# Patient Record
Sex: Male | Born: 1960
Health system: Southern US, Community
[De-identification: ages and names within clinical notes are randomized; demographics above are authoritative.]

## PROBLEM LIST (undated history)

## (undated) DIAGNOSIS — I10 Essential (primary) hypertension: Secondary | ICD-10-CM

## (undated) DIAGNOSIS — Z9989 Dependence on other enabling machines and devices: Secondary | ICD-10-CM

## (undated) DIAGNOSIS — R0789 Other chest pain: Secondary | ICD-10-CM

## (undated) DIAGNOSIS — R7309 Other abnormal glucose: Secondary | ICD-10-CM

## (undated) DIAGNOSIS — R053 Chronic cough: Secondary | ICD-10-CM

## (undated) DIAGNOSIS — K219 Gastro-esophageal reflux disease without esophagitis: Secondary | ICD-10-CM

## (undated) DIAGNOSIS — E230 Hypopituitarism: Secondary | ICD-10-CM

## (undated) DIAGNOSIS — R05 Cough: Secondary | ICD-10-CM

## (undated) DIAGNOSIS — E291 Testicular hypofunction: Secondary | ICD-10-CM

## (undated) DIAGNOSIS — Z8679 Personal history of other diseases of the circulatory system: Secondary | ICD-10-CM

## (undated) DIAGNOSIS — G4733 Obstructive sleep apnea (adult) (pediatric): Secondary | ICD-10-CM

## (undated) DIAGNOSIS — F411 Generalized anxiety disorder: Secondary | ICD-10-CM

## (undated) DIAGNOSIS — J309 Allergic rhinitis, unspecified: Secondary | ICD-10-CM

## (undated) DIAGNOSIS — E785 Hyperlipidemia, unspecified: Secondary | ICD-10-CM

## (undated) HISTORY — DX: Other abnormal glucose: R73.09

## (undated) HISTORY — DX: Generalized anxiety disorder: F41.1

## (undated) HISTORY — DX: Gastro-esophageal reflux disease without esophagitis: K21.9

## (undated) HISTORY — DX: Cough: R05

## (undated) HISTORY — DX: Obstructive sleep apnea (adult) (pediatric): Z99.89

## (undated) HISTORY — DX: Allergic rhinitis, unspecified: J30.9

## (undated) HISTORY — DX: Testicular hypofunction: E29.1

## (undated) HISTORY — DX: Obstructive sleep apnea (adult) (pediatric): G47.33

## (undated) HISTORY — DX: Hyperlipidemia, unspecified: E78.5

## (undated) HISTORY — DX: Other chest pain: R07.89

## (undated) HISTORY — DX: Hypopituitarism: E23.0

## (undated) HISTORY — DX: Personal history of other diseases of the circulatory system: Z86.79

## (undated) HISTORY — DX: Chronic cough: R05.3

---

## 1997-07-05 ENCOUNTER — Encounter: Admission: RE | Admit: 1997-07-05 | Discharge: 1997-10-03 | Payer: Self-pay

## 1999-01-02 ENCOUNTER — Encounter: Payer: Self-pay | Admitting: Emergency Medicine

## 1999-01-02 ENCOUNTER — Observation Stay (HOSPITAL_COMMUNITY): Admission: EM | Admit: 1999-01-02 | Discharge: 1999-01-03 | Payer: Self-pay | Admitting: Emergency Medicine

## 1999-01-03 HISTORY — PX: OTHER SURGICAL HISTORY: SHX169

## 2001-04-02 HISTORY — PX: OTHER SURGICAL HISTORY: SHX169

## 2002-04-02 HISTORY — PX: BREAST REDUCTION SURGERY: SHX8

## 2003-10-15 ENCOUNTER — Ambulatory Visit (HOSPITAL_BASED_OUTPATIENT_CLINIC_OR_DEPARTMENT_OTHER): Admission: RE | Admit: 2003-10-15 | Discharge: 2003-10-15 | Payer: Self-pay | Admitting: Otolaryngology

## 2003-11-04 ENCOUNTER — Emergency Department (HOSPITAL_COMMUNITY): Admission: EM | Admit: 2003-11-04 | Discharge: 2003-11-04 | Payer: Self-pay | Admitting: Emergency Medicine

## 2004-02-09 ENCOUNTER — Ambulatory Visit: Payer: Self-pay | Admitting: Family Medicine

## 2004-02-15 ENCOUNTER — Ambulatory Visit: Payer: Self-pay | Admitting: Endocrinology

## 2004-02-18 ENCOUNTER — Ambulatory Visit: Payer: Self-pay | Admitting: Endocrinology

## 2004-03-08 ENCOUNTER — Ambulatory Visit (HOSPITAL_COMMUNITY): Admission: RE | Admit: 2004-03-08 | Discharge: 2004-03-08 | Payer: Self-pay | Admitting: Endocrinology

## 2004-04-25 ENCOUNTER — Ambulatory Visit: Payer: Self-pay | Admitting: Family Medicine

## 2004-05-02 ENCOUNTER — Ambulatory Visit: Payer: Self-pay | Admitting: Endocrinology

## 2004-11-30 ENCOUNTER — Observation Stay (HOSPITAL_COMMUNITY): Admission: RE | Admit: 2004-11-30 | Discharge: 2004-12-01 | Payer: Self-pay | Admitting: Otolaryngology

## 2004-11-30 ENCOUNTER — Encounter (INDEPENDENT_AMBULATORY_CARE_PROVIDER_SITE_OTHER): Payer: Self-pay | Admitting: *Deleted

## 2005-01-19 ENCOUNTER — Ambulatory Visit: Payer: Self-pay | Admitting: Internal Medicine

## 2005-03-02 ENCOUNTER — Ambulatory Visit (HOSPITAL_BASED_OUTPATIENT_CLINIC_OR_DEPARTMENT_OTHER): Admission: RE | Admit: 2005-03-02 | Discharge: 2005-03-02 | Payer: Self-pay | Admitting: Otolaryngology

## 2005-03-11 ENCOUNTER — Ambulatory Visit: Payer: Self-pay | Admitting: Internal Medicine

## 2005-05-21 ENCOUNTER — Ambulatory Visit: Payer: Self-pay | Admitting: Family Medicine

## 2005-05-29 ENCOUNTER — Encounter: Admission: RE | Admit: 2005-05-29 | Discharge: 2005-05-29 | Payer: Self-pay | Admitting: Endocrinology

## 2005-06-22 ENCOUNTER — Ambulatory Visit: Payer: Self-pay | Admitting: Endocrinology

## 2005-08-03 ENCOUNTER — Ambulatory Visit: Payer: Self-pay | Admitting: Endocrinology

## 2006-02-13 ENCOUNTER — Ambulatory Visit: Payer: Self-pay | Admitting: Family Medicine

## 2006-03-11 ENCOUNTER — Ambulatory Visit (HOSPITAL_COMMUNITY): Admission: RE | Admit: 2006-03-11 | Discharge: 2006-03-11 | Payer: Self-pay | Admitting: *Deleted

## 2006-03-11 ENCOUNTER — Encounter (INDEPENDENT_AMBULATORY_CARE_PROVIDER_SITE_OTHER): Payer: Self-pay | Admitting: Specialist

## 2006-12-30 ENCOUNTER — Encounter: Payer: Self-pay | Admitting: *Deleted

## 2006-12-30 DIAGNOSIS — F411 Generalized anxiety disorder: Secondary | ICD-10-CM | POA: Insufficient documentation

## 2006-12-30 DIAGNOSIS — J309 Allergic rhinitis, unspecified: Secondary | ICD-10-CM

## 2006-12-30 DIAGNOSIS — R7309 Other abnormal glucose: Secondary | ICD-10-CM

## 2006-12-30 DIAGNOSIS — E291 Testicular hypofunction: Secondary | ICD-10-CM

## 2006-12-30 DIAGNOSIS — E785 Hyperlipidemia, unspecified: Secondary | ICD-10-CM

## 2006-12-30 HISTORY — DX: Testicular hypofunction: E29.1

## 2006-12-30 HISTORY — DX: Generalized anxiety disorder: F41.1

## 2006-12-30 HISTORY — DX: Other abnormal glucose: R73.09

## 2006-12-30 HISTORY — DX: Allergic rhinitis, unspecified: J30.9

## 2006-12-30 HISTORY — DX: Hyperlipidemia, unspecified: E78.5

## 2007-02-04 ENCOUNTER — Ambulatory Visit: Payer: Self-pay | Admitting: Family Medicine

## 2007-02-18 ENCOUNTER — Telehealth (INDEPENDENT_AMBULATORY_CARE_PROVIDER_SITE_OTHER): Payer: Self-pay | Admitting: *Deleted

## 2007-02-20 ENCOUNTER — Ambulatory Visit: Payer: Self-pay | Admitting: Endocrinology

## 2007-02-21 LAB — CONVERTED CEMR LAB: Testosterone: 308.19 ng/dL — ABNORMAL LOW (ref 350.00–890)

## 2008-03-29 ENCOUNTER — Telehealth: Payer: Self-pay | Admitting: Endocrinology

## 2008-04-08 ENCOUNTER — Ambulatory Visit: Payer: Self-pay | Admitting: Endocrinology

## 2008-04-08 DIAGNOSIS — E230 Hypopituitarism: Secondary | ICD-10-CM

## 2008-04-08 HISTORY — DX: Hypopituitarism: E23.0

## 2008-04-08 LAB — CONVERTED CEMR LAB
Iron: 81 ug/dL (ref 42–165)
Saturation Ratios: 19.1 % — ABNORMAL LOW (ref 20.0–50.0)
Testosterone: 409.91 ng/dL (ref 350.00–890)
Transferrin: 303.5 mg/dL (ref >=212.0)

## 2009-05-05 ENCOUNTER — Ambulatory Visit: Payer: Self-pay | Admitting: Internal Medicine

## 2009-05-05 DIAGNOSIS — R059 Cough, unspecified: Secondary | ICD-10-CM | POA: Insufficient documentation

## 2009-05-05 DIAGNOSIS — G4733 Obstructive sleep apnea (adult) (pediatric): Secondary | ICD-10-CM | POA: Insufficient documentation

## 2009-05-05 DIAGNOSIS — R05 Cough: Secondary | ICD-10-CM

## 2009-05-24 ENCOUNTER — Emergency Department (HOSPITAL_BASED_OUTPATIENT_CLINIC_OR_DEPARTMENT_OTHER): Admission: EM | Admit: 2009-05-24 | Discharge: 2009-05-24 | Payer: Self-pay | Admitting: Emergency Medicine

## 2009-07-29 ENCOUNTER — Ambulatory Visit: Payer: Self-pay | Admitting: Endocrinology

## 2009-07-29 LAB — CONVERTED CEMR LAB
PSA: 0.39 ng/mL (ref 0.10–4.00)
Testosterone: 377.65 ng/dL (ref 350.00–890.00)

## 2010-05-04 NOTE — Miscellaneous (Signed)
  Clinical Lists Changes  Medications: Changed medication from CLOMID 50 MG TABS (CLOMIPHENE CITRATE) one fourth of a tablet, daily to CLOMID 50 MG TABS (CLOMIPHENE CITRATE) one half of a tablet, daily - Signed Rx of CLOMID 50 MG TABS (CLOMIPHENE CITRATE) one half of a tablet, daily;  #45 x 3;  Signed;  Entered by: Minus Breeding MD;  Authorized by: Minus Breeding MD;  Method used: Electronic    Prescriptions: CLOMID 50 MG TABS (CLOMIPHENE CITRATE) one half of a tablet, daily  #45 x 3   Entered and Authorized by:   Minus Breeding MD   Signed by:   Minus Breeding MD on 02/20/2007   Method used:   Electronically sent to ...       [facility] [address]       [address]       [place], [place]  [address]       Ph: [phone]       Fax: [phone]   RxID:   [id number]

## 2010-05-04 NOTE — Progress Notes (Signed)
  Medications Added CLOMID 50 MG TABS (CLOMIPHENE CITRATE)        Phone Note Call from Patient Call back at Home Phone [phone]   Caller: Spouse/karen Call For: dr Everardo All Summary of Call: does he need to have labs  done before  his  appointment for 02/20/07 Patient's chart has been requested.  Initial call taken by: Shelbie Proctor,  February 18, 2007 1:12 PM  Follow-up for Phone Call        no, let's do at appt Follow-up by: Minus Breeding MD,  February 18, 2007 3:59 PM  Additional Follow-up for Phone Call Additional follow up Details #1::        Phone Call Completed, Provider Notified  PT NOTIFIED SPOKE WITH PT MNADE AWARE Additional Follow-up by: Shelbie Proctor,  February 18, 2007 4:32 PM    New/Updated Medications: CLOMID 50 MG TABS (CLOMIPHENE CITRATE)

## 2010-05-04 NOTE — Assessment & Plan Note (Signed)
Summary: MED REFILL  FU---STC   Vital Signs:  Patient profile:   50 year old male Height:      69 inches (175.26 cm) Weight:      232.38 pounds (105.63 kg) O2 Sat:      96 % on Room air Temp:     98.8 degrees F (37.11 degrees C) oral Pulse rate:   73 / minute BP sitting:   120 / 86  (left arm) Cuff size:   regular  Vitals Entered By: Josph Macho RMA (July 29, 2009 1:30 PM)  O2 Flow:  Room air CC: Follow up / pt needs med refill on Clomid/ pt states he is no longer taking Tramadol, Prednisone, or Benzonatate/ CF Is Patient Diabetic? No   Referring Provider:  Self Primary Provider:  Dr. Merri Brunette  CC:  Follow up / pt needs med refill on Clomid/ pt states he is no longer taking Tramadol, Prednisone, and or Benzonatate/ CF.  History of Present Illness: pt states the clomid works well.  he says ed is mild.  he attributes fatigue to age.    Current Medications (verified): 1)  Clomid 50 Mg Tabs (Clomiphene Citrate) .... One Half of A Tablet, Daily 2)  Benzonatate 100 Mg Caps (Benzonatate) .Marland Kitchen.. 1 Every 6 Hours As Needed For Cough 3)  Prednisone 10 Mg  Tabs (Prednisone) .... 4 Each Am X 2days, 2x2days, 1x2days and Stop 4)  Tramadol Hcl 50 Mg  Tabs (Tramadol Hcl) .... One To Two By Mouth Every 4-6 Hours  Allergies (verified): No Known Drug Allergies  Past History:  Past Medical History: Last updated: 05/05/2009 OBSTRUCTIVE SLEEP APNEA (ICD-327.23) PITUITARY INSUFFICIENCY (ICD-253.2) HYPERGLYCEMIA (ICD-790.29) TESTOSTERONE DEFICIENCY (ICD-257.2) HYPERLIPIDEMIA (ICD-272.4) ANXIETY (ICD-300.00) ALLERGIC RHINITIS (ICD-477.9) Chronic cough     - onset Dec 2010  GERD      - Pos EGD per Pt Virginia Rochester)   Review of Systems       he has slightly decreased urinary stream.  Physical Exam  General:  normal appearance.   Genitalia:  testes are small bilaterally Additional Exam:  Testosterone              377.65 ng/dL                161.09-604.54 PSA-Hyb                   0.39  ng/mL                  0.10-4.00    Impression & Recommendations:  Problem # 1:  TESTOSTERONE DEFICIENCY (ICD-257.2) well-controlled  Other Orders: TLB-Testosterone, Total (84403-TESTO) TLB-PSA (Prostate Specific Antigen) (84153-PSA) Est. Patient Level III (09811)  Patient Instructions: 1)  tests are being ordered for you today.  a few days after the test(s), please call [phone] to hear your test results. 2)  pending the test results, please continue the same medications for now 3)  Please schedule a follow-up appointment in 1 year. 4)  (update: i left message on phone-tree:  rx as we discussed) Prescriptions: CLOMID 50 MG TABS (CLOMIPHENE CITRATE) one half of a tablet, daily  #45 x 3   Entered and Authorized by:   Minus Breeding MD   Signed by:   Minus Breeding MD on 07/29/2009   Method used:   Print then Give to Patient   RxID:   [id number]

## 2010-05-04 NOTE — Assessment & Plan Note (Signed)
Summary: NEEDS MED REFILL/NML   Vital Signs:  Patient Profile:   50 Years Old Male Weight:      222 pounds Temp:     97.3 degrees F oral Pulse rate:   63 / minute BP sitting:   108 / 72  (left arm) Cuff size:   large  Vitals Entered By: Orlan Leavens (February 20, 2007 3:41 PM)                 Visit Type:  f/u  Chief Complaint:  hypogonadism.  History of Present Illness: patient takes Clomid as prescribed, one fourth of a 50-mg pill daily. he says it works well.   Current Allergies: No known allergies   Past Medical History:    Reviewed history from 12/30/2006 and no changes required:       Allergic rhinitis       Anxiety       Hyperlipidemia     Review of Systems       denies urinary hesitancy   Physical Exam  General:     healthy appearing.   Genitalia:     normal male, testes descended bilaterally without masses, no hernias noted.  left testicle is slightly small. Additional Exam:     testosterone=308 (350-850)    Impression & Recommendations:  Problem # 1:  TESTOSTERONE DEFICIENCY (ICD-257.2) needs increased rx Orders: Est. Patient Level III ([address])   Medications Added to Medication List This Visit: 1)  Clomid 50 Mg Tabs (Clomiphene citrate) .... One fourth of a tablet, daily  Other Orders: TLB-Testosterone, Total (84403-TESTO)   Patient Instructions: 1)  increase clomid to 1/2 of a 50 mg once daily 2)  recheck testosterone and psa 30d 3)  ret prn 4)  cc dr Renne Crigler    Prescriptions: CLOMID 50 MG TABS (CLOMIPHENE CITRATE) one fourth of a tablet, daily  #25 x 3   Entered and Authorized by:   Minus Breeding MD   Signed by:   Minus Breeding MD on 02/20/2007   Method used:   Electronically sent to ...       [facility] [address]       [address]       [place], [place]  [address]       Ph: [phone]       Fax: [phone]   RxID:   [id number]  ]

## 2010-05-04 NOTE — Progress Notes (Signed)
Summary: RX  Phone Note Call from Patient Call back at Home Phone [phone]   Caller: Spouse Call For: Minus Breeding MD Summary of Call: PER .PT WIFE NEED A REFILL OM MEDICATION APPT SCHEDULED FOR DR Lorane Gell  JAN 7  CLOMID 50MG          TAB    Instructions: 1/4QD - TAKE ONE-FOURTH TABLET BY MOUTH EVERY DAY [facility] [address]   Pharmacy Phone: [phone] Initial call taken by: Shelbie Proctor,  March 29, 2008 10:21 AM      Prescriptions: CLOMID 50 MG TABS (CLOMIPHENE CITRATE) one half of a tablet, daily  #45 x 0   Entered by:   Payton Spark CMA   Authorized by:   Minus Breeding MD   Signed by:   Payton Spark CMA on 03/29/2008   Method used:   Electronically to        [facility] [address] (retail)       [phone]       [place], [place]  [address]       Ph: [phone]       Fax: [phone]   RxID:   [id number]

## 2010-05-04 NOTE — Assessment & Plan Note (Signed)
Summary: Pulmonary new pt eval/ cough ? all gerd after uri   Visit Type:  Initial Consult Copy to:  Self Primary Provider/Referring Provider:  Dr. Merri Brunette  CC:  Cough.  History of Present Illness: 50 yowm never smoker with h/o allergies and sleep apnea with as needed use of allegra which has declined in intensity since sinus surgery 2005  May 05, 2009 initial pulmonary cc abupt onset before NY's with stuffy head, nasal congestion mostly dry cough and constant tickle in the throat esp with talking not exac early in am.  takes a prilosec every 3-4 days for hb despite prev egd x 3 years GERD per Dr Virginia Rochester.    Pt denies any significant sore throat, dysphagia, itching, sneezing,  nasal congestion or excess secretions,  fever, chills, sweats, unintended wt loss, pleuritic or exertional cp, hempoptysis, change in activity tolerance  orthopnea pnd or leg swelling Pt also denies any obvious fluctuation in symptoms with weather or environmental change or other alleviating or aggravating factors.       Current Medications (verified): 1)  Clomid 50 Mg Tabs (Clomiphene Citrate) .... One Half of A Tablet, Daily 2)  Benzonatate 100 Mg Caps (Benzonatate) .Marland Kitchen.. 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  The patient denies productive cough, coughing  up blood, irregular heartbeats, indigestion, loss of appetite, weight change, abdominal pain, difficulty swallowing, sore throat, tooth/dental problems, headaches, nasal congestion/difficulty breathing through nose, sneezing, itching, ear ache, anxiety, depression, hand/feet swelling, joint stiffness or pain, rash, change in color of mucus, and fever.    Vital Signs:  Patient profile:   50 year old male Height:      69 inches Weight:      228.50 pounds BMI:     48.69 O2 Sat:      96 % on Room air Temp:     97.7 degrees F oral Pulse rate:   76 / minute BP sitting:   120 / 80  (left arm)  Vitals Entered By: Vernie Murders (May 05, 2009 2:48 PM)  O2 Flow:  Room air  Physical Exam  Additional Exam:  wt 228 May 05, 2009 hoarse amb wm with harsh barking quality upper airway cough with pseudowheeze resolves with purse lip maneuver  HEENT: nl dentition, turbinates, and orophanx. Nl external ear canals without cough reflex NECK :  without JVD/Nodes/TM/ nl carotid upstrokes bilaterally LUNGS: no acc muscle use, clear to A and P bilaterally without cough on insp or exp maneuvers CV:  RRR  no s3 or murmur or increase in P2, no edema  ABD:  soft and nontender with nl excursion in the supine position. No bruits or organomegaly, bowel sounds nl MS:  warm without deformities, calf tenderness, cyanosis or clubbing SKIN: warm and dry without lesions   NEURO:  alert, approp, no deficits     CXR  Procedure date:  05/05/2009  Findings:      Comparison: None   Findings: Cardiomediastinal silhouette is unremarkable.  No acute infiltrate or pleural effusion.  No pulmonary edema.  Bony thorax is unremarkable.   IMPRESSION: No active disease.  Impression & Recommendations:  Problem # 1:  COUGH (ICD-786.2) The most common causes of chronic cough in immunocompetent adults include: upper airway cough syndrome (UACS), previously referred to as postnasal drip syndrome,  caused by variety  of rhinosinus conditions; (2) asthma; (3) GERD; (4) chronic bronchitis from cigarette smoking or other inhaled environmental irritants; (5) nonasthmatic eosinophilic bronchitis; and (6) bronchiectasis. These conditions, singly or in combination, have accounted for up to 94% of the causes of chronic cough in prospective studies.   Of the three most common causes of chronic cough, only oneGERD can actually cause the other two (asthma/sinusitis) and perpetuate the cylce of cough inducing airway trauma, inflammation, heightened sensitivity to reflux which is prompted by the cough itself via a cyclical mechanism.  This may partially respond to steroids and look like asthma and post nasal drainage but never erradicated completely unless the cough and the secondary reflux are eliminated, preferably both at the same time.   The standardized cough guidelines recently published in Chest are a 14 step process, not a single office visit,  and are intended  to address this problem logically,  with an alogrithm dependent on response to each progressive step  to determine a specific diagnosis with  minimal addtional testing needed. Therefore if compliance is an issue this empiric standardized approach simply won't work.   Comment:  while not intuitively obvious, many patients with chronic reflux did not cough until there is a second insult it disturbs the epithelial barrier that protects the upper airway from the effects of reflux.  Sometimes this is a viral URI, sometimes it is an endotracheal tube or other direct physical insult. Therefore once the cycle of coughing is eliminated, the reflux does not need to be treated as aggressively.    See instructions for specific recommendations   Medications Added to Medication List This Visit: 1)  Benzonatate 100 Mg Caps (Benzonatate) .Marland Kitchen.. 1 every 6 hours as needed for cough 2)  Prednisone 10 Mg Tabs (Prednisone) .... 4 each am x 2days, 2x2days, 1x2days and stop 3)  Tramadol  Hcl 50 Mg Tabs (Tramadol hcl) .... One to two by mouth every 4-6 hours  Other Orders: T-2 View CXR (71020TC) New Patient Level V ([address])  Patient Instructions: 1)  Acid reflux is the leading suspect here and needs to be eliminated  completely before considering additional studies or treatment options. To suppress this maximally, take prilosec 30 min I  before first and last meal and pepcid 20 mg (otc) at bedtime plus diet measures as listed until cough gone for two weeks without the need for cough suppression 2)  Take delsym two tsp every 12 hours and add tramadol 50 mg up to 2 every 4 hours to suppress the urge to cough. Swallowing water or using ice chips/non mint and menthol containing candies (such as lifesavers or sugarless jolly ranchers) are also effective.  3)  GERD (REFLUX)  is a common cause of respiratory symptoms. It commonly presents without heartburn and can be treated with medication, but also with lifestyle changes including avoidance of late meals, excessive alcohol, smoking cessation, and avoid fatty foods, chocolate, peppermint, colas, red wine, and acidic juices such as orange juice. NO MINT OR MENTHOL PRODUCTS SO NO COUGH  DROPS  4)  USE SUGARLESS CANDY INSTEAD (jolley ranchers)  5)  NO OIL BASED VITAMINS  6)  Prednisone 10mg  4 each am x 2days, 2x2days, 1x2days and stop  Prescriptions: TRAMADOL HCL 50 MG  TABS (TRAMADOL HCL) One to two by mouth every 4-6 hours  #40 x 0   Entered and Authorized by:   Nyoka Cowden MD   Signed by:   Nyoka Cowden MD on 05/05/2009   Method used:   Electronically to        [facility] [phone]* (retail)       [address] [place], [place]  [address]       Ph: [phone]       Fax: [phone]   RxID:   [id number] PREDNISONE 10 MG  TABS (PREDNISONE) 4 each am x 2days, 2x2days, 1x2days and stop  #14 x 0   Entered and Authorized by:   Nyoka Cowden MD   Signed by:   Nyoka Cowden MD on 05/05/2009   Method used:    Electronically to        [facility] [phone]* (retail)       [address] [place], [place]  [address]       Ph: [phone]       Fax: [phone]   RxID:   [id number]

## 2010-08-18 NOTE — Op Note (Signed)
Ross Moore, Ross Moore             ACCOUNT NO.:  [id number]   MEDICAL RECORD NO.:  [id number]          PATIENT TYPE:  AMB   LOCATION:  ENDO                         FACILITY:  [facility]   PHYSICIAN:  Georgiana Spinner, M.D.    DATE OF BIRTH:  1960-12-11   DATE OF PROCEDURE:  03/11/2006  DATE OF DISCHARGE:                               OPERATIVE REPORT   PROCEDURE:  Colonoscopy.   INDICATIONS:  Hemoccult positivity.   ANESTHESIA:  Demerol 30 mg, Versed 2 mg.   PROCEDURE:  With the patient mildly sedated in the left lateral  decubitus position, the Olympus videoscopic colonoscope was inserted  into the rectum and passed under direct vision to the cecum, identified  by the ileocecal valve and appendiceal orifice, both of which were  photographed.  From this point the colonoscope was slowly withdrawn  taking circumferential views of the colonic mucosa, stopping only in the  rectum, which appeared normal on direct and showed hemorrhoids on  retroflexed view.  The endoscope was straightened and withdrawn.  The  patient's vital signs and pulse oximeter remained stable.  The patient  tolerated the procedure well without apparent complications.   FINDINGS:  Internal hemorrhoids, otherwise an unremarkable colonoscopic  examination to the cecum.   PLAN:  See endoscopy note for further details.           ______________________________  Georgiana Spinner, M.D.     GMO/MEDQ  D:  03/11/2006  T:  03/11/2006  Job:  [id number]

## 2010-08-18 NOTE — Op Note (Signed)
NAMEABU, HEAVIN             ACCOUNT NO.:  [id number]   MEDICAL RECORD NO.:  [id number]          PATIENT TYPE:  AMB   LOCATION:  ENDO                         FACILITY:  [facility]   PHYSICIAN:  Georgiana Spinner, M.D.    DATE OF BIRTH:  [birth year]   DATE OF PROCEDURE:  03/11/2006  DATE OF DISCHARGE:                               OPERATIVE REPORT   PROCEDURE:  Upper endoscopy with biopsy.   INDICATIONS:  Reflux.   ANESTHESIA:  Demerol 70, Versed 8 mg.   PROCEDURE:  With the patient mildly sedated in the left lateral  decubitus position, the Olympus videoscopic endoscope was inserted in  the mouth and passed under direct vision through the esophagus which  appeared normal until we reached distal esophagus, and there were  changes of Barrett's photographed and biopsied.  We then entered into  the stomach.  Fundus, body, antrum, duodenal bulb, and second portion of  the duodenum appeared normal.  From this point, the endoscope was slowly  withdrawn taking circumferential views of duodenal mucosa until the  endoscope was pulled back into the stomach, placed in retroflexion to  view the stomach from below.  The endoscope was then straightened and  withdrawn, taking circumferential views remaining gastric and esophageal  mucosa.  The patient's vital signs and pulse oximeter remained stable.  The patient tolerated the procedure well without apparent complications.   FINDINGS:  Endoscopic changes of Barrett's esophagus.  Await biopsy  report.  The patient will call me for results and follow-up with me as  an outpatient.  Proceed to colonoscopy as planned.           ______________________________  Georgiana Spinner, M.D.     GMO/MEDQ  D:  03/11/2006  T:  03/11/2006  Job:  [id number]

## 2010-08-18 NOTE — Op Note (Signed)
NAMEJAVONE, YBANEZ             ACCOUNT NO.:  [id number]   MEDICAL RECORD NO.:  [id number]          PATIENT TYPE:  AMB   LOCATION:  SDS                          FACILITY:  [facility]   PHYSICIAN:  Kinnie Scales. Annalee Genta, M.D.DATE OF BIRTH:  [birth year]   DATE OF PROCEDURE:  11/30/2004  DATE OF DISCHARGE:                                 OPERATIVE REPORT   PREOPERATIVE DIAGNOSIS:  1.  Moderately severe obstructive sleep apnea.  2.  Deviated nasal septum and nasal obstruction.  3.  Tonsil and uvula palatal hypertrophy.  4.  Inferior turbinate hypertrophy   POSTOPERATIVE DIAGNOSIS:  1.  Moderately severe obstructive sleep apnea.  2.  Deviated nasal septum and nasal obstruction.  3.  Tonsil and uvula palatal hypertrophy.  4.  Inferior turbinate hypertrophy   INDICATIONS FOR SURGERY:  1.  Moderately severe obstructive sleep apnea.  2.  Deviated nasal septum and nasal obstruction.  3.  Tonsil and uvula palatal hypertrophy.  4.  Inferior turbinate hypertrophy.   SURGICAL PROCEDURE:  1.  Uvulopalatopharyngoplasty.  2.  Nasal septoplasty.  3.  Tonsillectomy.  4.  Inferior turbinate reduction.   ANESTHESIA:  General endotracheal.   SURGEON:  Kinnie Scales. Annalee Genta, M.D.   COMPLICATIONS:  None.   BLOOD LOSS:  Less than 100 mL.   DISPOSITION:  Patient transferred from the operating room to recovery room  in stable condition.   BRIEF HISTORY:  The patient is a 50 year old white male who has been  followed with a history of snoring and sleep apnea. The patient underwent an  inpatient sleep study at [facility] on October 15, 2003 which showed  an RDI of 32.6 events per hour with an O2 nadir to 87%. He was diagnosed  with moderately severe obstructive sleep apnea and was titrated with C-PAP  with adequate initial control of sleep apnea. The patient diligently wore  his C-PAP for the next approximately 8-9 months with significant progressive  difficulty wearing C-PAP successfully on a  nightly basis. The patient  complained of symptoms of airway congestion, obstruction and pressure with  discomfort from the mask and despite initial good response was seen for  reevaluation and given his failure to tolerate long-term C-PAP I recommended  we consider him for additional surgical intervention for the management of  sleep apnea. Based on history and physical examination I recommended the  above surgical procedures. Risk, benefits, and possible complications of  each of these procedures were discussed in detail with the patient and his  wife who understood and concurred plan for surgery which is scheduled as  above.   SURGICAL PROCEDURE:  The patient brought to the operating room at [facility] OR where general endotracheal anesthesia was established  without difficulty. The patient was adequately anesthetized. The patient was  positioned on the operating table, prepped and draped in sterile fashion and  injected with a total of 10 mL of 1% lidocaine 1:100,000 solution  epinephrine injected in submucosal fashion along the nasal septum, inferior  turbinates bilaterally. The patient's nose was then packed with Afrin-soaked  cottonoid pledgets which  were left in place for approximately 10 minutes for  vasoconstriction and hemostasis. After allowing adequate time the patient's  surgical procedure was begun with a nasal septoplasty. A left  hemitransfixion incision was created using a #15 scalpel and was carried  through the mucosa and underlying submucosa and a mucoperichondrial flap was  elevated on the patient's left-hand side. Bony cartilaginous junction was  crossed in the midline and a mucoperiosteal flap was elevated on the  patient's right. Deviated bone and cartilage in the mid and posterior  aspects of the nasal septum were then mobilized and resected. Mid septal  cartilage was removed, morselized and returned the mucoperichondrial pocket  at the conclusion  of the surgical procedure.  The patient had significant  bony septal spurring and deviation and a 4 mm osteotome was used to mobilize  the mid and posterior nasal septal bones as well as a large inferior septal  spur preserving the overlying mucosa. With septum brought to the midline  cartilage returned to the mucoperichondrial pocket and a 4-0 gut suture on a  Keith needle in horizontal mattress fashion was used to reapproximate  hemitransfixion incision and to coapt the soft tissue flaps. Bilateral Doyle  nasal septal splints were then placed after the application of Bactroban  ointment and were sutured in position with a 3-0 Ethilon suture.   Attention was then turned to the inferior turbinates where bilateral  inferior turbinate intramural cautery was performed with cautery set at 12  watts, two pass made in submucosal fashion. Each inferior turbinate. When  the turbinates had been adequately cauterized, they were outfractured to  create a more patent nasal cavity.   Attention then turned the patient's oral cavity and oropharynx. A Crowe-  Davis mouth gag was inserted without difficulty. There no loose or broken  teeth and hard and soft palate were intact. The procedure begun by  performing tonsillectomy using Bovie electrocautery and dissecting  subcapsular fashion. The entire left tonsil was removed from superior pole  to tongue base. Right tonsil removed in similar fashion. The tonsillar fossa  were gently abraded with a dry tonsil sponge and several areas of point  hemorrhage were then cauterized with suction cautery. The Crowe-Davis mouth  gag was released and reapplied. There was no active bleeding.   With tonsils was removed, uvulopalatopharyngoplasty was undertaken. The  hypertrophic extent of the palate and uvula was then estimated and using  Bovie electrocautery, incision was made along the anterior aspect of the soft palate at approximately the level of the anterior  tonsillar pillars.  This was carried from anterior to posterior preserving the posterior palatal  mucosa for reconstruction, removing approximately 1 cm strip of tissue  including mucosa and muscular layers and the entire uvula. Tissue sent to  pathology department with the tonsils for gross microscopic evaluation. The  palatal margin was then cauterized to ensure hemostasis and reconstruction  was undertaken with 4-0 Vicryl suture on a tapered needle in horizontal  mattress fashion to advance the posterior tonsillar pillars and close the  tonsillar fossa. The posterior palatal mucosa was then advanced anteriorly  and closed in interrupted sutures and at the conclusion of procedure, the  patient's nasal cavity, nasopharynx, oral cavity,  oropharynx were irrigated and suctioned. There was no active bleeding. Oral  gastric tube was passed. The stomach contents were aspirated. The patient  was awakened from the anesthetic, he was extubated, transferred from the  operating room to recovery room in stable condition. No complications.  Blood  loss less 100 mL.           ______________________________  Kinnie Scales. Annalee Genta, M.D.     DLS/MEDQ  D:  04/54/0981  T:  11/30/2004  Job:  [id number]

## 2010-08-18 NOTE — Procedures (Signed)
Ross Moore, Ross Moore             ACCOUNT NO.:  [id number]   MEDICAL RECORD NO.:  [id number]          PATIENT TYPE:  OUT   LOCATION:  SLEEP CENTER                 FACILITY:  [facility]   PHYSICIAN:  Clinton D. Maple Hudson, M.D. DATE OF BIRTH:  [birth year]   DATE OF STUDY:  03/02/2005                              NOCTURNAL POLYSOMNOGRAM   REFERRING PHYSICIAN:  Dr. Osborn Coho.   DATE OF STUDY:  March 02, 2005.   INDICATION FOR STUDY:  Hypersomnia with sleep apnea. Epworth sleepiness  score 14/24, BMI 32.4, weight 219 pounds.   SLEEP ARCHITECTURE:  Total sleep time 372 minutes with sleep efficiency 88%.  Stage I was 7%, stage II 50%, stages III and IV 15%, REM 28% of total sleep  time. Sleep latency 27 minutes, REM latency 105 minutes, awake after sleep  onset 26 minutes, arousal index 33. No bedtime medication taken.   RESPIRATORY DATA:  Split study protocol. Apnea hypopnea index (AHI, RDI)  49.1 obstructive events per hour including 1 obstructive apnea and 128  hypopneas before CPAP. Most initial sleep and most events were recorded  while supine. REM AHI of 14.9 per hour. CPAP was titrated to 10 CWP, AHI 4.6  per hour. A ResMed Swift with small nasal pillows was used, with a heated  humidifier.   OXYGEN DATA:  Mild to moderate snoring with oxygen desaturation to a nadir  of 80%. After CPAP control saturation held 94-98% on room air.   CARDIAC DATA:  Normal sinus rhythm with rare PVC.   MOVEMENT/PARASOMNIA:  A total of 129 limb jerks were recorded of which 21  were associated with arousal or awakening for a periodic limb movement with  arousal index of 3.4 per hour which is mildly increased.   IMPRESSION/RECOMMENDATION:  1.  Moderately severe obstructive sleep apnea/hypopnea syndrome, apnea      hypopnea index 49.1 per hour with mild to moderate snoring and oxygen      desaturation to 80%.  2.  Successful continuous positive airway pressure titration to 10 CWP,      apnea  hypopnea index 4.6 per hour. A      ResMed Swift with small nasal pillows and heated humidifier were used.  3.  Mild periodic limb movement with arousal, 3.4 per hour.      Clinton D. Maple Hudson, M.D.  Diplomate, Biomedical engineer of Sleep Medicine  Electronically Signed     CDY/MEDQ  D:  03/11/2005 09:25:08  T:  03/11/2005 21:09:57  Job:  [id number]

## 2010-08-18 NOTE — Procedures (Signed)
NAME:  Ross Moore, ZERBE          ACCOUNT NO.:  [id number]   MEDICAL RECORD NO.:  [id number]          PATIENT TYPE:  OUT   LOCATION:  SLEEP CENTER                 [facility]ACILITY[facility]  [facility]   PHYSICIAN:  Clinton D. Maple Hudson, M.D. DATE OF BIRTH:  February 17, 1961   DATE OF ADMISSION:  10/15/2003  DATE OF DISCHARGE:  10/15/2003                              NOCTURNAL POLYSOMNOGRAM   REFERRING PHYSICIAN:  Dr. Osborn Coho   INDICATION FOR STUDY/HISTORY:  Hypersomnia with obstructive sleep apnea,  loud snoring, excessive daytime somnolence.  Insulin-dependent diabetes  mellitus, coronary artery disease.  Epworth sleepiness score 18/24.  BMI  33.2, weight 225 pounds.  Split protocol was requested.   MEDICATIONS:  Lipitor, aspirin, Protonix, Effexor, Klonopin 0.5 mg b.i.d.,  Wellbutrin 300 mg daily, Imdur, Lopressor, Plavix.  No medication was taken  before this sleep study.   SLEEP ARCHITECTURE:  371 minutes of sleep were recorded, with a sleep  efficiency of 76%.  Of total sleep time, 12% was stage 1, 59% stage 2, 3%  stages 3 and 4, 26% REM.  Latency to sleep onset was 56 minutes, REM latency  was 247 minutes.   RESPIRATORY DATA:  Split protocol was followed.  There was moderately severe  obstructive sleep apnea/hypopnea (RDI 323.6/hour).  During 235 minutes of  sleep before CPAP there were 7 obstructive apneas and 72 obstructive  hypopneas.  Events were not positional.  REM RDI was 3.1/hour.  CPAP was  then titrated to 9 CWP (RDI 2.6/hour), using a medium Respironics gel mask  with good toleration and control.   OXYGEN DATA:  Moderate snoring before CPAP was associated with desaturation  to 87%.  With CPAP control was held 94-95%.   CARDIAC DATA:  No significant cardiac rhythm disorder.  Sinus rhythm was  maintained with heart rate 57-59 per minute.   MOVEMENT/PARASOMNIA:  106 body jerks were recorded of which 12 were  associated with brief arousals for a periodic limb movement with arousal  index of 2 episodes per hour.  This is probably clinically insignificant  periodic limb movement syndrome.   IMPRESSION/RECOMMENDATION:  Moderately severe obstructive sleep  apnea/hypopnea (RDI 32.6/hour) with mild oxygen desaturation and good  control on CPAP at 9 CWP as described.                                   ______________________________                                [name], M.D.                                Diplomate, American Board of Sleep Medicine   CDY/MEDQ  D:  10/17/2003 17:23:45  T:  10/18/2003 17:36:54  Job:  [id number]

## 2010-08-18 NOTE — Op Note (Signed)
NAME:  Ross Moore, Ross Moore                       ACCOUNT NO.:  [id number]   MEDICAL RECORD NO.:  [id number]                   PATIENT TYPE:  EMS   LOCATION:  ED                                   FACILITY:  [facility]   PHYSICIAN:  Lubertha Basque. Jerl Santos, M.D.             DATE OF BIRTH:  1960/05/11   DATE OF PROCEDURE:  11/04/2003  DATE OF DISCHARGE:                                 OPERATIVE REPORT   PREOPERATIVE DIAGNOSIS:  Open fractures, right foot, toes 1 and 2 and closed  fracture toe 3.   POSTOPERATIVE DIAGNOSIS:  Open fractures, right foot, toes 1 and 2 and  closed fracture toe 3.   PROCEDURE:  1. Irrigation and debridement and loose closure of toes 1 and 2.  2. Closed reduction toe 3.   ANESTHESIA:  Digital block and local with IV sedation.   ATTENDING SURGEON:  Lubertha Basque. Jerl Santos, M.D.   INDICATION FOR PROCEDURE:  The patient is a 50 year old man, who  unfortunately sustained a lawn mower to foot injury several hours ago.  He  was taken to the emergency room with an obvious deformity and some  significant bleeding.  He sustained a near-circumferential laceration of his  second toe through bone but did appear to have some good refill to the tip.  The first toe had about a 50% circumferential laceration involving bone but  again with good refill to the tip, and the third toe had no real laceration.  All three toes had distal phalanx fractures.  He is offered irrigation and  debridement with loose closure in hopes of avoiding infection and saving his  toes.  He was given a tetanus shot and IV antibiotic in the emergency room.   DESCRIPTION OF PROCEDURE:  The patient was seen in the emergency room and  prepped and draped in normal sterile fashion.  Digital block was achieved  with about 15 mL total lidocaine without epinephrine.  A thorough irrigation  and debridement was done of the first and second toe with Betadine and  normal saline.  These toes were then reduced and sutured loosely  with  Prolene.  The third toe had a mild gross deformity which was closed-reduced.  We placed Bacitracin ointment with some other dressings in order to maintain  the closed reduction, especially the second toe.  He was then wrapped  appropriately with a dressing.   DISPOSITION:  The patient was to be discharged home on oral Keflex and  Vicodin.  He will follow up in 2 days for a dressing change.                                               Lubertha Basque Jerl Santos, M.D.    PGD/MEDQ  D:  11/04/2003  T:  11/05/2003  Job:  [id number]

## 2010-09-04 ENCOUNTER — Other Ambulatory Visit: Payer: Self-pay | Admitting: Endocrinology

## 2010-10-02 ENCOUNTER — Ambulatory Visit (INDEPENDENT_AMBULATORY_CARE_PROVIDER_SITE_OTHER): Payer: BC Managed Care – PPO | Admitting: Endocrinology

## 2010-10-02 ENCOUNTER — Other Ambulatory Visit (INDEPENDENT_AMBULATORY_CARE_PROVIDER_SITE_OTHER): Payer: BC Managed Care – PPO

## 2010-10-02 ENCOUNTER — Encounter: Payer: Self-pay | Admitting: Endocrinology

## 2010-10-02 DIAGNOSIS — N400 Enlarged prostate without lower urinary tract symptoms: Secondary | ICD-10-CM

## 2010-10-02 DIAGNOSIS — E291 Testicular hypofunction: Secondary | ICD-10-CM

## 2010-10-02 LAB — PSA: PSA: 0.56 ng/mL (ref 0.10–4.00)

## 2010-10-02 LAB — TESTOSTERONE: Testosterone: 307.98 ng/dL — ABNORMAL LOW (ref 350.00–890.00)

## 2010-10-02 MED ORDER — TADALAFIL 5 MG PO TABS: 5.0000 mg | ORAL_TABLET | Freq: Every day | ORAL | Status: AC

## 2010-10-02 NOTE — Patient Instructions (Addendum)
blood tests are being ordered for you today.  please call [phone] to hear your test results.  You will be prompted to enter the 9-digit "MRN" number that appears at the top left of this page, followed by #.  Then you will hear the message. i have sent a prescription to your pharmacy for cialis 5 mg daily.  You may wish to lengthen the free pills by taking 1/2 tab daily.  This pill may help the ed and urinary symptoms.  i'll try the prior authorization after that if you want to continue. Please return in 1 year. (update: i left message on phone-tree:  options are increasing clomid to 50 mg qd, or changing to testim)

## 2010-10-02 NOTE — Progress Notes (Signed)
  Subjective:    Patient ID: Ross Moore, male    DOB: 13-Dec-1960, 50 y.o.   MRN: [id number]  HPI Pt states few mos of slightly decreased urinary stream, but no pain at the prostate.  No assoc hematuria. He also reports recurrent erectile dysfunction. He says he otherwise feels fine on the clomid--less fatigue.   Past Medical History  Diagnosis Date  . PITUITARY INSUFFICIENCY 04/08/2008  . TESTOSTERONE DEFICIENCY 12/30/2006  . HYPERGLYCEMIA 12/30/2006  . HYPERLIPIDEMIA 12/30/2006  . ANXIETY 12/30/2006  . OBSTRUCTIVE SLEEP APNEA 05/05/2009  . ALLERGIC RHINITIS 12/30/2006  . GERD (gastroesophageal reflux disease)     Post EGD per pt [place])  . Chronic cough onset Dec 2010    Past Surgical History  Procedure Date  . Breast reduction surgery 2004  . [place] 2003  . Stress cardiolite 01/03/1999    History   Social History  . Marital Status: Married    Spouse Name: N/A    Number of Children: N/A  . Years of Education: N/A   Occupational History  . Planner    Social History Main Topics  . Smoking status: Never Smoker   . Smokeless tobacco: Not on file  . Alcohol Use: Yes     2-3 times per wk  . Drug Use:   . Sexually Active:    Other Topics Concern  . Not on file   Social History Narrative   Children    No current outpatient prescriptions on file prior to visit.    No Known Allergies  Family History  Problem Relation Age of Onset  . Asthma Mother     BP 104/72  Pulse 64  Temp(Src) 98.3 F (36.8 C) (Oral)  Ht 5\' 9"  (1.753 m)  Wt 230 lb 6.4 oz (104.509 kg)  BMI 34.02 kg/m2  SpO2 95%  Review of Systems Denies edema.  He says sleep apnea is well-controlled.     Objective:   Physical Exam GENERAL: no distress GENITALIA: Normal male testicles, scrotum, and penis    Lab Results  Component Value Date   TESTOSTERONE 307.98* 10/02/2010   Assessment & Plan:  Urinary sxs, uncertain etiology Hypogonadism, needs increased rx Erectile dysfunction,  uncertain how much this is related to hypogonadism.

## 2010-10-03 ENCOUNTER — Telehealth: Payer: Self-pay

## 2010-10-03 MED ORDER — TESTOSTERONE 50 MG/5GM (1%) TD GEL: 5.0000 g | Freq: Every day | TRANSDERMAL | Status: DC

## 2010-10-03 NOTE — Telephone Encounter (Signed)
i printed 

## 2010-10-03 NOTE — Telephone Encounter (Signed)
Rx faxed to pharmacy, pt advised via VM

## 2010-10-03 NOTE — Telephone Encounter (Signed)
Pt's spouse called on behalf of pt requesting Rx that was advised by Dr Everardo All. Testim gel to pharmacy on file.

## 2010-10-09 ENCOUNTER — Telehealth: Payer: Self-pay | Admitting: *Deleted

## 2010-10-09 NOTE — Telephone Encounter (Signed)
Per pt's wife, PA denied for Testim gel-pt's wife is asking for MD's advisement on next step with insurance company to approve Testim gel since Clomid was not working for pt-pt's wife aware MD out of office until Monday

## 2010-10-10 NOTE — Telephone Encounter (Signed)
please call patient: i am willing to try increasing clomiphine to 1 tab of 50 mg qd.

## 2010-10-10 NOTE — Telephone Encounter (Signed)
Per [employer], pt does not meet the criteria for androgens. Clomid does not require a PA.

## 2010-10-10 NOTE — Telephone Encounter (Addendum)
Pt's wife was advised and stated that pt is willing to try whole tablet of clomid, she did state that pt was told at a previous OV that increasing Comid would not help with low Testosterone, pt is requesting advisement from MD on likelihood of increase helping? Karen's cell [phone] VM OK

## 2010-10-10 NOTE — Telephone Encounter (Signed)
What does ins co recommend as alternative?

## 2010-10-12 MED ORDER — CLOMIPHENE CITRATE 50 MG PO TABS: 50.0000 mg | ORAL_TABLET | Freq: Every day | ORAL | Status: AC

## 2010-10-12 NOTE — Telephone Encounter (Signed)
Pt's spouse is advised and states that pt wants to try medications for a few weeks and have labs re-check. If medication is ineffective spouse would like a appeal of PA testosterone gel to be done. Rx sent to pharmacy

## 2010-10-12 NOTE — Telephone Encounter (Signed)
i think changing to testosterone gel would be much more likely to normalize the testosterone level, but insurance refused.  Therefore, increasing clomid is worth a try

## 2010-11-06 ENCOUNTER — Telehealth: Payer: Self-pay

## 2010-11-06 DIAGNOSIS — E291 Testicular hypofunction: Secondary | ICD-10-CM

## 2010-11-06 NOTE — Telephone Encounter (Signed)
Pt's spouse called stating pt will be out of Clomid in one week and she wanted MD to advise on refill of follow labs first, please advise. Pt will be on medication for 4 weeks at the end of current Rx.

## 2010-11-06 NOTE — Telephone Encounter (Signed)
Please see phone note 10/09/2010

## 2010-11-06 NOTE — Telephone Encounter (Signed)
i ordered blood test

## 2010-11-06 NOTE — Telephone Encounter (Signed)
This was changed to "testim"

## 2010-11-07 NOTE — Telephone Encounter (Signed)
Pt advised to come in for lab work via home VM. Several messages left at both pt and spouse work numbers.

## 2010-11-08 ENCOUNTER — Other Ambulatory Visit (INDEPENDENT_AMBULATORY_CARE_PROVIDER_SITE_OTHER): Payer: BC Managed Care – PPO

## 2010-11-08 DIAGNOSIS — E291 Testicular hypofunction: Secondary | ICD-10-CM

## 2010-11-08 LAB — TESTOSTERONE: Testosterone: 377.62 ng/dL (ref 350.00–890.00)

## 2010-11-09 ENCOUNTER — Telehealth: Payer: Self-pay

## 2010-11-09 MED ORDER — CLOMIPHENE CITRATE 50 MG PO TABS: 50.0000 mg | ORAL_TABLET | Freq: Every day | ORAL | Status: AC

## 2010-11-09 NOTE — Telephone Encounter (Signed)
sent 

## 2010-11-09 NOTE — Telephone Encounter (Signed)
Pt advised via home VM, Rx e-scribed to pharmacy

## 2010-11-09 NOTE — Telephone Encounter (Signed)
Patient wife called [facility] stating that he was seen/had lab work recently. They received his results and was advised to take clomid (1 tab daily). Please advise on strength and if ok to send in a rx to [address].  They would like to pick up med from pharmacy before the weekend. Thanks

## 2010-11-30 ENCOUNTER — Telehealth: Payer: Self-pay

## 2010-11-30 MED ORDER — RALTEGRAVIR POTASSIUM 400 MG PO TABS: 400.0000 mg | ORAL_TABLET | Freq: Two times a day (BID) | ORAL | Status: DC

## 2010-11-30 MED ORDER — EMTRICITABINE-TENOFOVIR DF 200-300 MG PO TABS: 1.0000 | ORAL_TABLET | Freq: Every day | ORAL | Status: DC
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
## 2010-11-30 NOTE — Telephone Encounter (Signed)
Left message for pt's spouse to callback office.  
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
## 2010-11-30 NOTE — Telephone Encounter (Signed)
i called.  Fast busy signal Please leave phone note open, as i was not finished with it.

## 2010-11-30 NOTE — Telephone Encounter (Signed)
Pt's spouse advised and is requesting a call back at [phone]

## 2010-11-30 NOTE — Telephone Encounter (Signed)
Pt's spouse called back stating that two medications called into local pharmacy needs to be sent into specialty pharmacy. Rx needs to be called in by physician only due to qualifying questions per pt per [employer].  [phone]

## 2010-11-30 NOTE — Telephone Encounter (Signed)
Please call wife and tell her pt will need to come in for ov tomorrow if they need anything else.

## 2010-11-30 NOTE — Telephone Encounter (Signed)
i called the [phone] number again, and ai again got a busy signal.  Please ask wife how i can help her.

## 2010-11-30 NOTE — Telephone Encounter (Signed)
raltegravir  Berenda Morale

## 2010-12-01 ENCOUNTER — Telehealth: Payer: Self-pay

## 2010-12-01 MED ORDER — EMTRICITABINE-TENOFOVIR DF 200-300 MG PO TABS: 1.0000 | ORAL_TABLET | Freq: Every day | ORAL | Status: DC

## 2010-12-01 MED ORDER — RALTEGRAVIR POTASSIUM 400 MG PO TABS: 400.0000 mg | ORAL_TABLET | Freq: Two times a day (BID) | ORAL | Status: DC

## 2010-12-01 NOTE — Telephone Encounter (Signed)
Pt's spouse informed rx sent to local pharmacy and to callback for an OV if anything else needed via VM. Left message for pt to callback office with any questions or concerns.

## 2010-12-01 NOTE — Telephone Encounter (Signed)
Pt advised of MD's recommendation yesterday and states that Insurance company advised Rx to [facility]. Pt advised of same

## 2010-12-08 ENCOUNTER — Telehealth: Payer: Self-pay | Admitting: *Deleted
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
## 2010-12-11 ENCOUNTER — Ambulatory Visit (INDEPENDENT_AMBULATORY_CARE_PROVIDER_SITE_OTHER): Payer: BC Managed Care – PPO | Admitting: *Deleted

## 2010-12-11 DIAGNOSIS — Z23 Encounter for immunization: Secondary | ICD-10-CM

## 2010-12-11 NOTE — Telephone Encounter (Signed)
Pt's wife called back and transferred to scheduler for nurse visit

## 2011-01-10 ENCOUNTER — Ambulatory Visit (INDEPENDENT_AMBULATORY_CARE_PROVIDER_SITE_OTHER): Payer: BC Managed Care – PPO | Admitting: *Deleted

## 2011-01-10 DIAGNOSIS — Z23 Encounter for immunization: Secondary | ICD-10-CM

## 2011-01-26 ENCOUNTER — Other Ambulatory Visit (INDEPENDENT_AMBULATORY_CARE_PROVIDER_SITE_OTHER): Payer: BC Managed Care – PPO

## 2011-01-26 ENCOUNTER — Encounter: Payer: Self-pay | Admitting: Endocrinology

## 2011-01-26 ENCOUNTER — Other Ambulatory Visit: Payer: Self-pay | Admitting: Endocrinology

## 2011-01-26 ENCOUNTER — Ambulatory Visit (INDEPENDENT_AMBULATORY_CARE_PROVIDER_SITE_OTHER): Payer: BC Managed Care – PPO | Admitting: Endocrinology

## 2011-01-26 DIAGNOSIS — F411 Generalized anxiety disorder: Secondary | ICD-10-CM

## 2011-01-26 DIAGNOSIS — E785 Hyperlipidemia, unspecified: Secondary | ICD-10-CM

## 2011-01-26 DIAGNOSIS — N400 Enlarged prostate without lower urinary tract symptoms: Secondary | ICD-10-CM

## 2011-01-26 DIAGNOSIS — Z79899 Other long term (current) drug therapy: Secondary | ICD-10-CM

## 2011-01-26 DIAGNOSIS — R7309 Other abnormal glucose: Secondary | ICD-10-CM

## 2011-01-26 DIAGNOSIS — E291 Testicular hypofunction: Secondary | ICD-10-CM

## 2011-01-26 LAB — LIPID PANEL
Cholesterol: 209 mg/dL — ABNORMAL HIGH (ref 0–200)
HDL: 46.9 mg/dL (ref >39.00)
Total CHOL/HDL Ratio: 4
Triglycerides: 187 mg/dL — ABNORMAL HIGH (ref 0.0–149.0)
VLDL: 37.4 mg/dL (ref 0.0–40.0)

## 2011-01-26 LAB — CBC WITH DIFFERENTIAL/PLATELET
Basophils Absolute: 0 10*3/uL (ref 0.0–0.1)
Basophils Relative: 0.2 % (ref 0.0–3.0)
Eosinophils Absolute: 0.2 10*3/uL (ref 0.0–0.7)
Eosinophils Relative: 1.9 % (ref 0.0–5.0)
HCT: 45.4 % (ref 39.0–52.0)
Hemoglobin: 15.2 g/dL (ref 13.0–17.0)
Lymphocytes Relative: 26.8 % (ref 12.0–46.0)
Lymphs Abs: 2.9 10*3/uL (ref 0.7–4.0)
MCHC: 33.5 g/dL (ref 30.0–36.0)
MCV: 89.2 fl (ref 78.0–100.0)
Monocytes Absolute: 0.9 10*3/uL (ref 0.1–1.0)
Monocytes Relative: 8.8 % (ref 3.0–12.0)
Neutro Abs: 6.7 10*3/uL (ref 1.4–7.7)
Neutrophils Relative %: 62.3 % (ref 43.0–77.0)
Platelets: 335 10*3/uL (ref 150.0–400.0)
RBC: 5.09 Mil/uL (ref 4.22–5.81)
RDW: 13.6 % (ref 11.5–14.6)
WBC: 10.7 10*3/uL — ABNORMAL HIGH (ref 4.5–10.5)

## 2011-01-26 LAB — URINALYSIS, ROUTINE W REFLEX MICROSCOPIC
Bilirubin Urine: NEGATIVE
Hgb urine dipstick: NEGATIVE
Ketones, ur: NEGATIVE
Leukocytes, UA: NEGATIVE
Nitrite: NEGATIVE
Specific Gravity, Urine: 1.015 (ref 1.000–1.030)
Total Protein, Urine: NEGATIVE
Urine Glucose: NEGATIVE
Urobilinogen, UA: 0.2 (ref 0.0–1.0)
pH: 7 (ref 5.0–8.0)

## 2011-01-26 LAB — BASIC METABOLIC PANEL
BUN: 19 mg/dL (ref 6–23)
CO2: 32 mEq/L (ref 19–32)
Calcium: 9.3 mg/dL (ref 8.4–10.5)
Chloride: 104 mEq/L (ref 96–112)
Creatinine, Ser: 1.1 mg/dL (ref 0.4–1.5)
GFR: 75.96 mL/min (ref >60.00)
Glucose, Bld: 95 mg/dL (ref 70–99)
Potassium: 4.5 mEq/L (ref 3.5–5.1)
Sodium: 143 mEq/L (ref 135–145)

## 2011-01-26 LAB — HEPATIC FUNCTION PANEL
ALT: 41 U/L (ref 0–53)
AST: 31 U/L (ref 0–37)
Albumin: 4.4 g/dL (ref 3.5–5.2)
Alkaline Phosphatase: 61 U/L (ref 39–117)
Bilirubin, Direct: 0 mg/dL (ref 0.0–0.3)
Total Bilirubin: 0.6 mg/dL (ref 0.3–1.2)
Total Protein: 7.5 g/dL (ref 6.0–8.3)

## 2011-01-26 LAB — TESTOSTERONE: Testosterone: 383.84 ng/dL (ref 350.00–890.00)

## 2011-01-26 LAB — LDL CHOLESTEROL, DIRECT: Direct LDL: 152.5 mg/dL

## 2011-01-26 LAB — HEMOGLOBIN A1C: Hgb A1c MFr Bld: 5.9 % (ref 4.6–6.5)

## 2011-01-26 LAB — TSH: TSH: 1.24 u[IU]/mL (ref 0.35–5.50)

## 2011-01-26 MED ORDER — ZOLPIDEM TARTRATE 10 MG PO TABS: 10.0000 mg | ORAL_TABLET | Freq: Every evening | ORAL | Status: AC | PRN

## 2011-01-26 NOTE — Progress Notes (Signed)
  Subjective:    Patient ID: Ross Moore, male    DOB: 1960/09/06, 50 y.o.   MRN: [id number]  HPI On clomid, testosterone increased to normal.  He has a few mos of slight sensation of urinary hesitation at the prostate area, but no assoc sxs there.  He says he doesn't need rx for this, as it is mild.  He says he can usually get to sleep at night, but then awakens in the middle of the night, and can't get back to sleep.  He relates this to being laid-of from his job, 2 years ago.  He works now at a [employer], but at a lesser position.   Past Medical History  Diagnosis Date  . PITUITARY INSUFFICIENCY 04/08/2008  . TESTOSTERONE DEFICIENCY 12/30/2006  . HYPERGLYCEMIA 12/30/2006  . HYPERLIPIDEMIA 12/30/2006  . ANXIETY 12/30/2006  . OBSTRUCTIVE SLEEP APNEA 05/05/2009  . ALLERGIC RHINITIS 12/30/2006  . GERD (gastroesophageal reflux disease)     Post EGD per pt Virginia Rochester)  . Chronic cough onset Dec 2010    Past Surgical History  Procedure Date  . Breast reduction surgery 2004  . Ver[place]ale 2003  . Stress cardiolite 01/03/1999    History   Social History  . Marital Status: Married    Spouse Name: N/A    Number of Children: N/A  . Years of Education: N/A   Occupational History  . Planner    Social History Main Topics  . Smoking status: Never Smoker   . Smokeless tobacco: Not on file  . Alcohol Use: Yes     2-3 times per wk  . Drug Use:   . Sexually Active:    Other Topics Concern  . Not on file   Social History Narrative   Children    No current outpatient prescriptions on file prior to visit.    No Known Allergies  Family History  Problem Relation Age of Onset  . Asthma Mother     BP 128/82  Pulse 71  Temp(Src) 99.1 F (37.3 C) (Oral)  Ht 5\' 9"  (1.753 m)  Wt 231 lb (104.781 kg)  BMI 34.11 kg/m2  SpO2 96%    Review of Systems Little if any depression.  However, he has a few mos of mild anxiety and insomnia    Objective:   Physical  Exam VITAL SIGNS:  See vs page GENERAL: no distress.  Obese PSYCH: Alert and oriented x 3.  Does not appear anxious nor depressed.   Lab Results  Component Value Date   WBC 10.7* 01/26/2011   HGB 15.2 01/26/2011   HCT 45.4 01/26/2011   PLT 335.0 01/26/2011   GLUCOSE 95 01/26/2011   CHOL 209* 01/26/2011   TRIG 187.0* 01/26/2011   HDL 46.90 01/26/2011   LDLDIRECT 152.5 01/26/2011   ALT 41 01/26/2011   AST 31 01/26/2011   NA 143 01/26/2011   K 4.5 01/26/2011   CL 104 01/26/2011   CREATININE 1.1 01/26/2011   BUN 19 01/26/2011   CO2 32 01/26/2011   TSH 1.24 01/26/2011   PSA 0.56 10/02/2010   HGBA1C 5.9 01/26/2011       Assessment & Plan:  Central hypogonadism, well-controlled Very mild prostatism, new Dyslipidemia, new  Hyperglycemia, stable

## 2011-01-26 NOTE — Patient Instructions (Addendum)
blood tests are being ordered for you today.  please call [phone] to hear your test results.  You will be prompted to enter the 9-digit "MRN" number that appears at the top left of this page, followed by #.  Then you will hear the message. Here is a prescription, to help you sleep at night. Here is a business card, for a group of psychologists, who can help also.  (update: i left message on phone-tree:  Some advise chol med in this situation).

## 2011-03-14 DIAGNOSIS — R0789 Other chest pain: Secondary | ICD-10-CM

## 2011-03-14 HISTORY — DX: Other chest pain: R07.89

## 2011-07-11 ENCOUNTER — Ambulatory Visit (INDEPENDENT_AMBULATORY_CARE_PROVIDER_SITE_OTHER): Payer: BC Managed Care – PPO

## 2011-07-11 DIAGNOSIS — Z23 Encounter for immunization: Secondary | ICD-10-CM

## 2012-04-24 ENCOUNTER — Other Ambulatory Visit: Payer: Self-pay | Admitting: Gastroenterology

## 2012-04-24 DIAGNOSIS — R109 Unspecified abdominal pain: Secondary | ICD-10-CM

## 2012-05-13 ENCOUNTER — Encounter (HOSPITAL_COMMUNITY): Payer: BC Managed Care – PPO

## 2012-05-13 ENCOUNTER — Ambulatory Visit (HOSPITAL_COMMUNITY): Payer: BC Managed Care – PPO

## 2012-05-14 ENCOUNTER — Emergency Department (HOSPITAL_COMMUNITY)
Admission: EM | Admit: 2012-05-14 | Discharge: 2012-05-14 | Disposition: A | Discharge status: Home / Self Care | Payer: BC Managed Care – PPO | Attending: Emergency Medicine | Admitting: Emergency Medicine

## 2012-05-14 ENCOUNTER — Encounter (HOSPITAL_COMMUNITY): Payer: Self-pay | Admitting: Emergency Medicine

## 2012-05-14 ENCOUNTER — Emergency Department (HOSPITAL_COMMUNITY): Payer: BC Managed Care – PPO

## 2012-05-14 DIAGNOSIS — Z79899 Other long term (current) drug therapy: Secondary | ICD-10-CM | POA: Insufficient documentation

## 2012-05-14 DIAGNOSIS — G8929 Other chronic pain: Secondary | ICD-10-CM | POA: Insufficient documentation

## 2012-05-14 DIAGNOSIS — R109 Unspecified abdominal pain: Secondary | ICD-10-CM | POA: Insufficient documentation

## 2012-05-14 DIAGNOSIS — R143 Flatulence: Secondary | ICD-10-CM | POA: Insufficient documentation

## 2012-05-14 DIAGNOSIS — R142 Eructation: Secondary | ICD-10-CM | POA: Insufficient documentation

## 2012-05-14 DIAGNOSIS — R141 Gas pain: Secondary | ICD-10-CM | POA: Insufficient documentation

## 2012-05-14 DIAGNOSIS — K219 Gastro-esophageal reflux disease without esophagitis: Secondary | ICD-10-CM | POA: Insufficient documentation

## 2012-05-14 DIAGNOSIS — Z8639 Personal history of other endocrine, nutritional and metabolic disease: Secondary | ICD-10-CM | POA: Insufficient documentation

## 2012-05-14 DIAGNOSIS — K5900 Constipation, unspecified: Secondary | ICD-10-CM | POA: Insufficient documentation

## 2012-05-14 DIAGNOSIS — Z862 Personal history of diseases of the blood and blood-forming organs and certain disorders involving the immune mechanism: Secondary | ICD-10-CM | POA: Insufficient documentation

## 2012-05-14 DIAGNOSIS — F411 Generalized anxiety disorder: Secondary | ICD-10-CM | POA: Insufficient documentation

## 2012-05-14 DIAGNOSIS — R110 Nausea: Secondary | ICD-10-CM | POA: Insufficient documentation

## 2012-05-14 LAB — COMPREHENSIVE METABOLIC PANEL
ALT: 22 U/L (ref 0–53)
AST: 20 U/L (ref 0–37)
Albumin: 3.8 g/dL (ref 3.5–5.2)
Alkaline Phosphatase: 61 U/L (ref 39–117)
BUN: 14 mg/dL (ref 6–23)
CO2: 29 mEq/L (ref 19–32)
Calcium: 8.7 mg/dL (ref 8.4–10.5)
Chloride: 101 mEq/L (ref 96–112)
Creatinine, Ser: 0.98 mg/dL (ref 0.50–1.35)
GFR calc Af Amer: >90 mL/min (ref >90)
GFR calc non Af Amer: >90 mL/min (ref >90)
Glucose, Bld: 108 mg/dL — ABNORMAL HIGH (ref 70–99)
Potassium: 3.9 mEq/L (ref 3.5–5.1)
Sodium: 138 mEq/L (ref 135–145)
Total Bilirubin: 0.6 mg/dL (ref 0.3–1.2)
Total Protein: 7.1 g/dL (ref 6.0–8.3)

## 2012-05-14 LAB — CBC WITH DIFFERENTIAL/PLATELET
Basophils Absolute: 0 10*3/uL (ref 0.0–0.1)
Basophils Relative: 1 % (ref 0–1)
Eosinophils Absolute: 0.2 10*3/uL (ref 0.0–0.7)
Eosinophils Relative: 3 % (ref 0–5)
HCT: 45 % (ref 39.0–52.0)
Hemoglobin: 15.3 g/dL (ref 13.0–17.0)
Lymphocytes Relative: 27 % (ref 12–46)
Lymphs Abs: 2.3 10*3/uL (ref 0.7–4.0)
MCH: 29.8 pg (ref 26.0–34.0)
MCHC: 34 g/dL (ref 30.0–36.0)
MCV: 87.5 fL (ref 78.0–100.0)
Monocytes Absolute: 0.8 10*3/uL (ref 0.1–1.0)
Monocytes Relative: 9 % (ref 3–12)
Neutro Abs: 5.1 10*3/uL (ref 1.7–7.7)
Neutrophils Relative %: 60 % (ref 43–77)
Platelets: 346 10*3/uL (ref 150–400)
RBC: 5.14 MIL/uL (ref 4.22–5.81)
RDW: 13.7 % (ref 11.5–15.5)
WBC: 8.4 10*3/uL (ref 4.0–10.5)

## 2012-05-14 LAB — LIPASE, BLOOD: Lipase: 36 U/L (ref 11–59)

## 2012-05-14 MED ORDER — IOHEXOL 300 MG/ML  SOLN
100.0000 mL | Freq: Once | INTRAMUSCULAR | Status: AC | PRN
  Administered 2012-05-14: 100 mL via INTRAVENOUS

## 2012-05-14 MED ORDER — ONDANSETRON 4 MG PO TBDP
4.0000 mg | ORAL_TABLET | Freq: Once | ORAL | Status: AC
  Administered 2012-05-14: 4 mg via ORAL
  Filled 2012-05-14: qty 1

## 2012-05-14 MED ORDER — IOHEXOL 300 MG/ML  SOLN
50.0000 mL | Freq: Once | INTRAMUSCULAR | Status: AC | PRN
  Administered 2012-05-14: 50 mL via ORAL

## 2012-05-14 MED ORDER — ONDANSETRON 8 MG PO TBDP: 8.0000 mg | ORAL_TABLET | Freq: Three times a day (TID) | ORAL | Status: DC | PRN

## 2012-05-14 NOTE — ED Provider Notes (Signed)
History     CSN: [id number]  Arrival date & time 05/14/12  0740   First MD Initiated Contact with Patient 05/14/12 0750      Chief Complaint  Patient presents with  . Abdominal Pain    HPI Patient is a 53 yo M with PMH of HLD, GERD and OSA presenting for evaluation of 4 months of progressively worsening abdominal pain and bloating. Pt has been evaluated by GI recently for pain. He had a normal colonoscopy and EGD a few weeks ago. He has been treated for H.pylori as well. He also had a cardiac work up by [facility] to rule out cardiac cause of abd pain which was negative. Pt states that in the last few days he has felt even more bloated, increased pain in RUQ and also felt a "bulge" on his right flank last night. He endorses persistent nausea, as well as weight gain. Denies fever, dysuria. He does endorse light headedness, SOB and feeling of constipation after colonoscopy but he is having "watery stools."   Past Medical History  Diagnosis Date  . PITUITARY INSUFFICIENCY 04/08/2008  . TESTOSTERONE DEFICIENCY 12/30/2006  . HYPERGLYCEMIA 12/30/2006  . HYPERLIPIDEMIA 12/30/2006  . ANXIETY 12/30/2006  . OBSTRUCTIVE SLEEP APNEA 05/05/2009  . ALLERGIC RHINITIS 12/30/2006  . GERD (gastroesophageal reflux disease)     Post EGD per pt Virginia Rochester)  . Chronic cough onset Dec 2010    Past Surgical History  Procedure Laterality Date  . Breast reduction surgery  2004  . Vericoale  2003  . Stress cardiolite  01/03/1999    Family History  Problem Relation Age of Onset  . Asthma Mother     History  Substance Use Topics  . Smoking status: Never Smoker   . Smokeless tobacco: Not on file  . Alcohol Use: Yes     Comment: 2-3 times per wk      Review of Systems  Constitutional: Positive for activity change and appetite change. Negative for fever and chills.  HENT: Negative for congestion and sore throat.   Respiratory: Positive for shortness of breath. Negative for cough and  wheezing.   Cardiovascular: Negative for chest pain and leg swelling.  Gastrointestinal: Positive for nausea, abdominal pain, constipation and abdominal distention. Negative for vomiting and blood in stool.  Genitourinary: Negative for hematuria and difficulty urinating.  Musculoskeletal: Positive for back pain.  Skin: Negative for rash.  All other systems reviewed and are negative.    Allergies  Review of patient's allergies indicates no known allergies.  Home Medications   Current Outpatient Rx  Name  Route  Sig  Dispense  Refill  . pantoprazole (PROTONIX) 40 MG tablet   Oral   Take 40 mg by mouth 2 (two) times daily.         Marland Kitchen PRESCRIPTION MEDICATION   Intramuscular   Inject 1 mL into the muscle every 14 (fourteen) days. Testosterone Cypionate 200mg /ml Injection.         . sertraline (ZOLOFT) 100 MG tablet   Oral   Take 150 mg by mouth every morning.         . ondansetron (ZOFRAN ODT) 8 MG disintegrating tablet   Oral   Take 1 tablet (8 mg total) by mouth every 8 (eight) hours as needed for nausea.   20 tablet   0     BP 135/84  Pulse 68  Temp(Src) 97.8 F (36.6 C) (Oral)  SpO2 98%  Physical Exam  Constitutional: He is  oriented to person, place, and time. He appears well-developed. No distress.  HENT:  Head: Normocephalic and atraumatic.  Eyes: Pupils are equal, round, and reactive to light.  Neck: Normal range of motion. Neck supple.  Cardiovascular: Normal rate, regular rhythm, normal heart sounds and intact distal pulses.   No murmur heard. Pulmonary/Chest: Effort normal and breath sounds normal. He has no wheezes.  Abdominal: Bowel sounds are normal. He exhibits distension. He exhibits no pulsatile midline mass. There is tenderness in the right upper quadrant and right lower quadrant. There is no rebound, no guarding, no CVA tenderness and negative Murphy's sign.  Musculoskeletal: Normal range of motion. He exhibits no edema.  Lymphadenopathy:    He  has no cervical adenopathy.  Neurological: He is alert and oriented to person, place, and time. No cranial nerve deficit.  Skin: Skin is warm and dry. No rash noted.  Psychiatric: He has a normal mood and affect.    ED Course  Procedures (including critical care time)  Labs Reviewed  COMPREHENSIVE METABOLIC PANEL - Abnormal; Notable for the following:    Glucose, Bld 108 (*)    All other components within normal limits  CBC WITH DIFFERENTIAL  LIPASE, BLOOD   Ct Abdomen Pelvis W Contrast  05/14/2012  *RADIOLOGY REPORT*  Clinical Data: abdominal pain,  CT ABDOMEN AND PELVIS WITH CONTRAST  Technique:  Multidetector CT imaging of the abdomen and pelvis was performed following the standard protocol during bolus administration of intravenous contrast.  Contrast: 50mL OMNIPAQUE IOHEXOL 300 MG/ML  SOLN, OMNIPAQUE IOHEXOL 300 MG/ML  SOLN  Comparison: None.  Findings: Lung bases are clear.  No pleural or pericardial effusion.  There is no focal liver abnormality.  The gallbladder appears normal.  No biliary dilatation.  The pancreas is normal.  Normal appearance of the spleen.  The adrenal glands are both unremarkable.  The right kidney appears normal.  The left kidney is normal.  Urinary bladder is unremarkable.  Prostate gland and seminal vesicles are negative.  The abdominal aorta has a normal caliber.  There is no evidence for aneurysm.  No upper abdominal adenopathy.  No enlarged pelvic or inguinal lymph nodes.  The stomach is normal.  The small bowel loops have a normal course and caliber without evidence for bowel obstruction.  Appendix is visualized and appears normal.  Proximal colon opacifies with contrast.  Scattered distal colonic diverticula identified without acute inflammation.  There is a small periumbilical hernia containing fat only.  Review of the visualized bony structures is significant for mild degenerative disc disease within the lumbar spine.  IMPRESSION:  1.  No acute findings  identified within the abdomen or pelvis. 2.  Small periumbilical hernia contains fat only.   Original Report Authenticated By: Signa Kell, M.D.      1. Chronic abdominal pain   2. Nausea      MDM  52 yo M with worsening abdominal pain and distension   Labs normal, and CT scan unremarkable. DDx includes peptic ulcer, irritable bowel, gallbladder dysfunction. Discussed normal results with patient and have encouraged him to follow up with Dr. Elnoria Howard as previously scheduled. Will d/c with Zofran to take as needed for nausea, and he can take Miralax as needed for constipation. D/c home in stable condition.    Hilarie Fredrickson, MD 05/14/12 1037

## 2012-05-14 NOTE — ED Provider Notes (Signed)
I  reviewed the resident's note and I agree with the findings and plan.     Nelia Shi, MD 05/14/12 1038

## 2012-05-14 NOTE — ED Notes (Addendum)
Pt c/o of abdominal pain that has been going on for a while now. States that he feels very bloated and its painful to press on stomach. Had an endoscopy and colonoscopy about 3 weeks ago and everything came back negative. States he feels a buldge on the right side of abdomen. Pain worsens after eating and pt states he stays nauseous.

## 2012-05-29 ENCOUNTER — Ambulatory Visit (HOSPITAL_COMMUNITY)
Admission: RE | Admit: 2012-05-29 | Discharge: 2012-05-29 | Disposition: A | Discharge status: Home / Self Care | Payer: BC Managed Care – PPO | Source: Ambulatory Visit | Attending: Gastroenterology | Admitting: Gastroenterology

## 2012-05-29 ENCOUNTER — Encounter (HOSPITAL_COMMUNITY): Payer: Self-pay

## 2012-05-29 ENCOUNTER — Encounter (HOSPITAL_COMMUNITY)
Admission: RE | Admit: 2012-05-29 | Discharge: 2012-05-29 | Disposition: A | Discharge status: Home / Self Care | Payer: BC Managed Care – PPO | Source: Ambulatory Visit | Attending: Gastroenterology | Admitting: Gastroenterology

## 2012-05-29 DIAGNOSIS — R532 Functional quadriplegia: Secondary | ICD-10-CM | POA: Insufficient documentation

## 2012-05-29 DIAGNOSIS — R109 Unspecified abdominal pain: Secondary | ICD-10-CM

## 2012-05-29 MED ORDER — TECHNETIUM TC 99M MEBROFENIN IV KIT
4.8000 | PACK | Freq: Once | INTRAVENOUS | Status: AC | PRN
  Administered 2012-05-29: 5 via INTRAVENOUS

## 2012-05-30 ENCOUNTER — Encounter: Payer: Self-pay | Admitting: Cardiology

## 2014-06-21 ENCOUNTER — Emergency Department (HOSPITAL_COMMUNITY)
Admission: EM | Admit: 2014-06-21 | Discharge: 2014-06-21 | Disposition: A | Discharge status: Home / Self Care | Payer: BLUE CROSS/BLUE SHIELD | Attending: Emergency Medicine | Admitting: Emergency Medicine

## 2014-06-21 ENCOUNTER — Encounter (HOSPITAL_COMMUNITY): Payer: Self-pay | Admitting: *Deleted

## 2014-06-21 DIAGNOSIS — K219 Gastro-esophageal reflux disease without esophagitis: Secondary | ICD-10-CM | POA: Insufficient documentation

## 2014-06-21 DIAGNOSIS — Z8679 Personal history of other diseases of the circulatory system: Secondary | ICD-10-CM | POA: Insufficient documentation

## 2014-06-21 DIAGNOSIS — Z8709 Personal history of other diseases of the respiratory system: Secondary | ICD-10-CM | POA: Diagnosis not present

## 2014-06-21 DIAGNOSIS — G4733 Obstructive sleep apnea (adult) (pediatric): Secondary | ICD-10-CM | POA: Insufficient documentation

## 2014-06-21 DIAGNOSIS — R197 Diarrhea, unspecified: Secondary | ICD-10-CM | POA: Diagnosis not present

## 2014-06-21 DIAGNOSIS — R109 Unspecified abdominal pain: Secondary | ICD-10-CM | POA: Insufficient documentation

## 2014-06-21 DIAGNOSIS — Z79899 Other long term (current) drug therapy: Secondary | ICD-10-CM | POA: Diagnosis not present

## 2014-06-21 DIAGNOSIS — Z7982 Long term (current) use of aspirin: Secondary | ICD-10-CM | POA: Diagnosis not present

## 2014-06-21 DIAGNOSIS — R509 Fever, unspecified: Secondary | ICD-10-CM | POA: Diagnosis not present

## 2014-06-21 DIAGNOSIS — Z9981 Dependence on supplemental oxygen: Secondary | ICD-10-CM | POA: Diagnosis not present

## 2014-06-21 DIAGNOSIS — R112 Nausea with vomiting, unspecified: Secondary | ICD-10-CM | POA: Diagnosis not present

## 2014-06-21 DIAGNOSIS — Z8659 Personal history of other mental and behavioral disorders: Secondary | ICD-10-CM | POA: Insufficient documentation

## 2014-06-21 DIAGNOSIS — R630 Anorexia: Secondary | ICD-10-CM | POA: Diagnosis not present

## 2014-06-21 DIAGNOSIS — E291 Testicular hypofunction: Secondary | ICD-10-CM | POA: Diagnosis not present

## 2014-06-21 LAB — COMPREHENSIVE METABOLIC PANEL
ALT: 36 U/L (ref 0–53)
AST: 27 U/L (ref 0–37)
Albumin: 4.2 g/dL (ref 3.5–5.2)
Alkaline Phosphatase: 65 U/L (ref 39–117)
Anion gap: 8 (ref 5–15)
BUN: 18 mg/dL (ref 6–23)
CO2: 29 mmol/L (ref 19–32)
Calcium: 9 mg/dL (ref 8.4–10.5)
Chloride: 104 mmol/L (ref 96–112)
Creatinine, Ser: 1.16 mg/dL (ref 0.50–1.35)
GFR calc Af Amer: 83 mL/min — ABNORMAL LOW (ref >90)
GFR calc non Af Amer: 71 mL/min — ABNORMAL LOW (ref >90)
Glucose, Bld: 104 mg/dL — ABNORMAL HIGH (ref 70–99)
Potassium: 3.9 mmol/L (ref 3.5–5.1)
Sodium: 141 mmol/L (ref 135–145)
Total Bilirubin: 0.9 mg/dL (ref 0.3–1.2)
Total Protein: 7.3 g/dL (ref 6.0–8.3)

## 2014-06-21 LAB — CBC WITH DIFFERENTIAL/PLATELET
Basophils Absolute: 0 10*3/uL (ref 0.0–0.1)
Basophils Relative: 0 % (ref 0–1)
Eosinophils Absolute: 0.1 10*3/uL (ref 0.0–0.7)
Eosinophils Relative: 1 % (ref 0–5)
HCT: 52.2 % — ABNORMAL HIGH (ref 39.0–52.0)
Hemoglobin: 17.5 g/dL — ABNORMAL HIGH (ref 13.0–17.0)
Lymphocytes Relative: 27 % (ref 12–46)
Lymphs Abs: 2 10*3/uL (ref 0.7–4.0)
MCH: 29.2 pg (ref 26.0–34.0)
MCHC: 33.5 g/dL (ref 30.0–36.0)
MCV: 87.1 fL (ref 78.0–100.0)
Monocytes Absolute: 1.2 10*3/uL — ABNORMAL HIGH (ref 0.1–1.0)
Monocytes Relative: 17 % — ABNORMAL HIGH (ref 3–12)
Neutro Abs: 4 10*3/uL (ref 1.7–7.7)
Neutrophils Relative %: 55 % (ref 43–77)
Platelets: 302 10*3/uL (ref 150–400)
RBC: 5.99 MIL/uL — ABNORMAL HIGH (ref 4.22–5.81)
RDW: 14.2 % (ref 11.5–15.5)
WBC: 7.3 10*3/uL (ref 4.0–10.5)

## 2014-06-21 LAB — URINALYSIS, ROUTINE W REFLEX MICROSCOPIC
Bilirubin Urine: NEGATIVE
Glucose, UA: NEGATIVE mg/dL
Hgb urine dipstick: NEGATIVE
Ketones, ur: NEGATIVE mg/dL
Leukocytes, UA: NEGATIVE
Nitrite: NEGATIVE
Protein, ur: NEGATIVE mg/dL
Specific Gravity, Urine: 1.028 (ref 1.005–1.030)
Urobilinogen, UA: 0.2 mg/dL (ref 0.0–1.0)
pH: 5.5 (ref 5.0–8.0)

## 2014-06-21 LAB — LIPASE, BLOOD: Lipase: 32 U/L (ref 11–59)

## 2014-06-21 MED ORDER — SODIUM CHLORIDE 0.9 % IV BOLUS (SEPSIS)
1000.0000 mL | Freq: Once | INTRAVENOUS | Status: AC
  Administered 2014-06-21: 1000 mL via INTRAVENOUS

## 2014-06-21 MED ORDER — ONDANSETRON HCL 4 MG/2ML IJ SOLN
4.0000 mg | Freq: Once | INTRAMUSCULAR | Status: AC
  Administered 2014-06-21: 4 mg via INTRAVENOUS
  Filled 2014-06-21: qty 2

## 2014-06-21 NOTE — ED Notes (Signed)
Fluids finished, labs resulted, patient anxious to leave.  Provider notified.

## 2014-06-21 NOTE — ED Notes (Signed)
Mathis, PA at bedside 

## 2014-06-21 NOTE — ED Notes (Signed)
Pt was instructed by PCP to by evaluated in the ED for possible appendicitis. Pt states he reported to his PCP intermittent right side abdominal pain this week. Pt denies abdominal pain at this time. Pt also reports chills, n/v/d since yesterday. Pt's main complaint at this time is nausea. [facility] regarding similar abdominal symptoms.

## 2014-06-21 NOTE — Discharge Instructions (Signed)
Nausea and Vomiting Nausea is a sick feeling that often comes before throwing up (vomiting). Vomiting is a reflex where stomach contents come out of your mouth. Vomiting can cause severe loss of body fluids (dehydration). Children and elderly adults can become dehydrated quickly, especially if they also have diarrhea. Nausea and vomiting are symptoms of a condition or disease. It is important to find the cause of your symptoms. CAUSES   Direct irritation of the stomach lining. This irritation can result from increased acid production (gastroesophageal reflux disease), infection, food poisoning, taking certain medicines (such as nonsteroidal anti-inflammatory drugs), alcohol use, or tobacco use.  Signals from the brain.These signals could be caused by a headache, heat exposure, an inner ear disturbance, increased pressure in the brain from injury, infection, a tumor, or a concussion, pain, emotional stimulus, or metabolic problems.  An obstruction in the gastrointestinal tract (bowel obstruction).  Illnesses such as diabetes, hepatitis, gallbladder problems, appendicitis, kidney problems, cancer, sepsis, atypical symptoms of a heart attack, or eating disorders.  Medical treatments such as chemotherapy and radiation.  Receiving medicine that makes you sleep (general anesthetic) during surgery. DIAGNOSIS Your caregiver may ask for tests to be done if the problems do not improve after a few days. Tests may also be done if symptoms are severe or if the reason for the nausea and vomiting is not clear. Tests may include:  Urine tests.  Blood tests.  Stool tests.  Cultures (to look for evidence of infection).  X-rays or other imaging studies. Test results can help your caregiver make decisions about treatment or the need for additional tests. TREATMENT You need to stay well hydrated. Drink frequently but in small amounts.You may wish to drink water, sports drinks, clear broth, or eat frozen  ice pops or gelatin dessert to help stay hydrated.When you eat, eating slowly may help prevent nausea.There are also some antinausea medicines that may help prevent nausea. HOME CARE INSTRUCTIONS   Take all medicine as directed by your caregiver.  If you do not have an appetite, do not force yourself to eat. However, you must continue to drink fluids.  If you have an appetite, eat a normal diet unless your caregiver tells you differently.  Eat a variety of complex carbohydrates (rice, wheat, potatoes, bread), lean meats, yogurt, fruits, and vegetables.  Avoid high-fat foods because they are more difficult to digest.  Drink enough water and fluids to keep your urine clear or pale yellow.  If you are dehydrated, ask your caregiver for specific rehydration instructions. Signs of dehydration may include:  Severe thirst.  Dry lips and mouth.  Dizziness.  Dark urine.  Decreasing urine frequency and amount.  Confusion.  Rapid breathing or pulse. SEEK IMMEDIATE MEDICAL CARE IF:   You have blood or brown flecks (like coffee grounds) in your vomit.  You have black or bloody stools.  You have a severe headache or stiff neck.  You are confused.  You have severe abdominal pain.  You have chest pain or trouble breathing.  You do not urinate at least once every 8 hours.  You develop cold or clammy skin.  You continue to vomit for longer than 24 to 48 hours.  You have a fever. MAKE SURE YOU:   Understand these instructions.  Will watch your condition.  Will get help right away if you are not doing well or get worse. Document Released: 03/19/2005 Document Revised: 06/11/2011 Document Reviewed: 08/16/2010 Exit[employer] Patient Information 2015 [employer]. This information is not intended   to replace advice given to you by your health care provider. Make sure you discuss any questions you have with your health care provider. Diarrhea Diarrhea is frequent loose and watery  bowel movements. It can cause you to feel weak and dehydrated. Dehydration can cause you to become tired and thirsty, have a dry mouth, and have decreased urination that often is dark yellow. Diarrhea is a sign of another problem, most often an infection that will not last long. In most cases, diarrhea typically lasts 2-3 days. However, it can last longer if it is a sign of something more serious. It is important to treat your diarrhea as directed by your caregiver to lessen or prevent future episodes of diarrhea. CAUSES  Some common causes include:  Gastrointestinal infections caused by viruses, bacteria, or parasites.  Food poisoning or food allergies.  Certain medicines, such as antibiotics, chemotherapy, and laxatives.  Artificial sweeteners and fructose.  Digestive disorders. HOME CARE INSTRUCTIONS  Ensure adequate fluid intake (hydration): Have 1 cup (8 oz) of fluid for each diarrhea episode. Avoid fluids that contain simple sugars or sports drinks, fruit juices, whole milk products, and sodas. Your urine should be clear or pale yellow if you are drinking enough fluids. Hydrate with an oral rehydration solution that you can purchase at pharmacies, retail stores, and online. You can prepare an oral rehydration solution at home by mixing the following ingredients together:   - tsp table salt.   tsp baking soda.   tsp salt substitute containing potassium chloride.  1  tablespoons sugar.  1 L (34 oz) of water.  Certain foods and beverages may increase the speed at which food moves through the gastrointestinal (GI) tract. These foods and beverages should be avoided and include:  Caffeinated and alcoholic beverages.  High-fiber foods, such as raw fruits and vegetables, nuts, seeds, and whole grain breads and cereals.  Foods and beverages sweetened with sugar alcohols, such as xylitol, sorbitol, and mannitol.  Some foods may be well tolerated and may help thicken stool  including:  Starchy foods, such as rice, toast, pasta, low-sugar cereal, oatmeal, grits, baked potatoes, crackers, and bagels.  Bananas.  Applesauce.  Add probiotic-rich foods to help increase healthy bacteria in the GI tract, such as yogurt and fermented milk products.  Wash your hands well after each diarrhea episode.  Only take over-the-counter or prescription medicines as directed by your caregiver.  Take a warm bath to relieve any burning or pain from frequent diarrhea episodes. SEEK IMMEDIATE MEDICAL CARE IF:   You are unable to keep fluids down.  You have persistent vomiting.  You have blood in your stool, or your stools are black and tarry.  You do not urinate in 6-8 hours, or there is only a small amount of very dark urine.  You have abdominal pain that increases or localizes.  You have weakness, dizziness, confusion, or light-headedness.  You have a severe headache.  Your diarrhea gets worse or does not get better.  You have a fever or persistent symptoms for more than 2-3 days.  You have a fever and your symptoms suddenly get worse. MAKE SURE YOU:   Understand these instructions.  Will watch your condition.  Will get help right away if you are not doing well or get worse. Document Released: 03/09/2002 Document Revised: 08/03/2013 Document Reviewed: 11/25/2011 [facility] Patient Information 2015 [place], [place]. This information is not intended to replace advice given to you by your health care provider. Make sure you discuss any  questions you have with your health care provider. Abdominal Pain Many things can cause abdominal pain. Usually, abdominal pain is not caused by a disease and will improve without treatment. It can often be observed and treated at home. Your health care provider will do a physical exam and possibly order blood tests and X-rays to help determine the seriousness of your pain. However, in many cases, more time must pass before a clear cause  of the pain can be found. Before that point, your health care provider may not know if you need more testing or further treatment. HOME CARE INSTRUCTIONS  Monitor your abdominal pain for any changes. The following actions may help to alleviate any discomfort you are experiencing:  Only take over-the-counter or prescription medicines as directed by your health care provider.  Do not take laxatives unless directed to do so by your health care provider.  Try a clear liquid diet (broth, tea, or water) as directed by your health care provider. Slowly move to a bland diet as tolerated. SEEK MEDICAL CARE IF:  You have unexplained abdominal pain.  You have abdominal pain associated with nausea or diarrhea.  You have pain when you urinate or have a bowel movement.  You experience abdominal pain that wakes you in the night.  You have abdominal pain that is worsened or improved by eating food.  You have abdominal pain that is worsened with eating fatty foods.  You have a fever. SEEK IMMEDIATE MEDICAL CARE IF:   Your pain does not go away within 2 hours.  You keep throwing up (vomiting).  Your pain is felt only in portions of the abdomen, such as the right side or the left lower portion of the abdomen.  You pass bloody or black tarry stools. MAKE SURE YOU:  Understand these instructions.   Will watch your condition.   Will get help right away if you are not doing well or get worse.  Document Released: 12/27/2004 Document Revised: 03/24/2013 Document Reviewed: 11/26/2012 [facility] Patient Information 2015 [place]. This information is not intended to replace advice given to you by your health care provider. Make sure you discuss any questions you have with your health care provider.

## 2014-06-21 NOTE — ED Notes (Signed)
Everlene FarrierWilliam Dansie, PA at bedside

## 2014-06-21 NOTE — ED Provider Notes (Signed)
CSN: [id number]     Arrival date & time 06/21/14  1633 History   First MD Initiated Contact with Patient 06/21/14 1906     Chief Complaint  Patient presents with  . Abdominal Pain  . Nausea   Ross Moore is a 54 y.o. male with a history of GERD, hyperlipidemia, and chronic intermittent nausea, who presents to the ED complaining of abdominal pain, nausea, vomiting, diarrhea, body aches and chills with onset yesterday morning. Patient reports that yesterday morning he woke up with body aches and chills and then last night began having vomiting and diarrhea. He also reports having intermittent right-sided abdominal pain which he reports he has had intermittently for several years. He reports that he has intermittent right-sided abdominal pain that lasts seconds to minutes and can be sharp or dull. He currently denies any abdominal pain. He reports having 5 episodes of diarrhea today and last vomited last night. He denies any vomiting today. His major current complaint is nausea. He has taken nothing for treatment today. The patient reports he was seen by his primary care doctor today who sent him to the emergency department. The patient denies dysuria, hematuria, hematochezia, hematemesis, sick contacts, chest pain, shortness of breath, or previous abdominal surgeries.  (Consider location/radiation/quality/duration/timing/severity/associated sxs/prior Treatment) HPI  Past Medical History  Diagnosis Date  . PITUITARY INSUFFICIENCY 04/08/2008  . TESTOSTERONE DEFICIENCY 12/30/2006  . HYPERGLYCEMIA 12/30/2006  . HYPERLIPIDEMIA 12/30/2006  . ANXIETY 12/30/2006  . ALLERGIC RHINITIS 12/30/2006  . GERD (gastroesophageal reflux disease)     Post EGD per pt Lajoyce Corners)  . Chronic cough onset Dec 2010  . OSA on CPAP   . Atypical chest pain 03/14/2011    met test,stress myoview 01/10/2012-no ischemia  . Hx of viral pericarditis     negataive ECHO 01/09/2012- there had been ?hx. pericardtitis   Past Surgical  History  Procedure Laterality Date  . Breast reduction surgery  2004  . [place]  2003  . Stress cardiolite  01/03/1999   Family History  Problem Relation Age of Onset  . Asthma Mother   . Hypertension Mother   . Hyperlipidemia Mother    History  Substance Use Topics  . Smoking status: Never Smoker   . Smokeless tobacco: Never Used  . Alcohol Use: Yes     Comment: 2-3 times per wk    Review of Systems  Constitutional: Positive for fever, chills and appetite change.  HENT: Negative for congestion, ear pain, sore throat and trouble swallowing.   Eyes: Negative for pain and visual disturbance.  Respiratory: Negative for cough, shortness of breath and wheezing.   Cardiovascular: Negative for chest pain and palpitations.  Gastrointestinal: Positive for nausea, vomiting, abdominal pain and diarrhea. Negative for blood in stool.  Genitourinary: Negative for dysuria, urgency, frequency, hematuria, flank pain and difficulty urinating.  Musculoskeletal: Negative for back pain and neck pain.  Skin: Negative for rash.  Neurological: Negative for headaches.      Allergies  Lipitor  Home Medications   Prior to Admission medications   Medication Sig Start Date End Date Taking? Authorizing Provider  Ascorbic Acid (VITAMIN C) 100 MG tablet Take 100 mg by mouth daily.   Yes Historical Provider, MD  aspirin 81 MG tablet Take 81 mg by mouth daily.   Yes Historical Provider, MD  fish oil-omega-3 fatty acids 1000 MG capsule Take 2 g by mouth daily.   Yes Historical Provider, MD  Multiple Vitamin (MULTIVITAMIN) tablet Take 1 tablet by mouth daily.  Yes Historical Provider, MD  naproxen (NAPROSYN) 500 MG tablet Take 500 mg by mouth daily as needed for mild pain.    Yes Historical Provider, MD  OVER THE COUNTER MEDICATION Take 15 mLs by mouth daily as needed (cold symptoms). "Daytime Medication" [employer]   Yes Historical Provider, MD  pantoprazole (PROTONIX) 40 MG tablet Take 40 mg by  mouth 2 (two) times daily.   Yes Historical Provider, MD  Phenyleph-Doxyl-DM-Aspirin (ALKA-SELTZER PLUS NIGHT COLD PO) Take 2 capsules by mouth daily as needed (cold / congestion).   Yes Historical Provider, MD  rosuvastatin (CRESTOR) 20 MG tablet Take 20 mg by mouth daily.    Yes Historical Provider, MD  testosterone cypionate (DEPOTESTOTERONE CYPIONATE) 100 MG/ML injection Inject 100 mg into the muscle every 14 (fourteen) days. For IM use only   Yes Historical Provider, MD  ondansetron (ZOFRAN ODT) 8 MG disintegrating tablet Take 1 tablet (8 mg total) by mouth every 8 (eight) hours as needed for nausea. Patient not taking: Reported on 06/21/2014 05/14/12   Montez Morita, MD   BP 134/85 mmHg  Pulse 59  Temp(Src) 97.9 F (36.6 C) (Oral)  Resp 20  Ht _0  (1.727 m)  Wt 228 lb (103.42 kg)  BMI 34.68 kg/m2  SpO2 94% Physical Exam  Constitutional: He is oriented to person, place, and time. He appears well-developed and well-nourished. No distress.  Nontoxic-appearing.  HENT:  Head: Normocephalic and atraumatic.  Right Ear: External ear normal.  Left Ear: External ear normal.  Mouth/Throat: Oropharynx is clear and moist. No oropharyngeal exudate.  Eyes: Conjunctivae are normal. Pupils are equal, round, and reactive to light. Right eye exhibits no discharge. Left eye exhibits no discharge.  Neck: Neck supple.  Cardiovascular: Normal rate, regular rhythm, normal heart sounds and intact distal pulses.  Exam reveals no gallop and no friction rub.   No murmur heard. Pulmonary/Chest: Effort normal and breath sounds normal. No respiratory distress. He has no wheezes. He has no rales.  Abdominal: Soft. Bowel sounds are normal. He exhibits no distension and no mass. There is no tenderness. There is no rebound and no guarding.  Abdomen is soft and nontender to palpation. Bowel sounds are present. No McBurney's point tenderness. Negative Murphy's sign. Negative Rovsing sign. Negative psoas and  obturator sign.  Musculoskeletal: He exhibits no edema.  Lymphadenopathy:    He has no cervical adenopathy.  Neurological: He is alert and oriented to person, place, and time. Coordination normal.  Skin: Skin is warm and dry. No rash noted. He is not diaphoretic. No erythema. No pallor.  Psychiatric: He has a normal mood and affect. His behavior is normal.  Nursing note and vitals reviewed.   ED Course  Procedures (including critical care time) Labs Review Labs Reviewed  CBC WITH DIFFERENTIAL/PLATELET - Abnormal; Notable for the following:    RBC 5.99 (*)    Hemoglobin 17.5 (*)    HCT 52.2 (*)    Monocytes Relative 17 (*)    Monocytes Absolute 1.2 (*)    All other components within normal limits  COMPREHENSIVE METABOLIC PANEL - Abnormal; Notable for the following:    Glucose, Bld 104 (*)    GFR calc non Af Amer 71 (*)    GFR calc Af Amer 83 (*)    All other components within normal limits  URINALYSIS, ROUTINE W REFLEX MICROSCOPIC - Abnormal; Notable for the following:    Color, Urine AMBER (*)    All other components within normal limits  LIPASE, BLOOD    Imaging Review No results found.   EKG Interpretation None      Filed Vitals:   06/21/14 2015 06/21/14 2030 06/21/14 2045 06/21/14 2100  BP: 115/80 124/83 123/86 134/85  Pulse: 60 54 55 59  Temp:      TempSrc:      Resp:      Height:      Weight:      SpO2: 91% 92% 94% 94%     MDM   Meds given in ED:  Medications  sodium chloride 0.9 % bolus 1,000 mL (0 mLs Intravenous Stopped 06/21/14 2051)  ondansetron (ZOFRAN) injection 4 mg (4 mg Intravenous Given 06/21/14 2006)    Discharge Medication List as of 06/21/2014  9:11 PM      Final diagnoses:  Nausea vomiting and diarrhea   This  is a 54 y.o. male with a history of GERD, hyperlipidemia, and chronic intermittent nausea, who presents to the ED complaining of abdominal pain, nausea, vomiting, diarrhea, body aches and chills with onset yesterday morning.  Abdominal pain is intermittent and he denies current abd pain.  Patient is afebrile and nontoxic appearing. Patient's abdomen is soft and nontender to palpation. No peritoneal signs. Urinalysis is unremarkable and negative for infection or hemoglobin. CBC is unremarkable. CMP is also unremarkable. Lipase is normal. Patient given fluid bolus and Zofran for his nausea. Had reevaluation the patient reports his nausea has resolved he denies any abdominal pain currently. I see no need for imaging at this time. Strict return precautions provided. I suggested trying Bentyl or GI cocktail prior to discharge the patient would like to go home. The patient does not wish for any pain or nausea medicine for at home. I advised the patient to follow-up with their primary care provider this week. I advised the patient to return to the emergency department with new or worsening symptoms or new concerns. The patient verbalized understanding and agreement with plan.   This patient was discussed with Dr. Tamera Punt who agrees with assessment and plan.     Waynetta Pean, PA-C 06/22/14 [place], MD 06/22/14 1308

## 2015-12-16 DIAGNOSIS — Z125 Encounter for screening for malignant neoplasm of prostate: Secondary | ICD-10-CM | POA: Diagnosis not present

## 2015-12-16 DIAGNOSIS — Z0000 Encounter for general adult medical examination without abnormal findings: Secondary | ICD-10-CM | POA: Diagnosis not present

## 2015-12-19 DIAGNOSIS — E875 Hyperkalemia: Secondary | ICD-10-CM | POA: Diagnosis not present

## 2015-12-23 DIAGNOSIS — G4733 Obstructive sleep apnea (adult) (pediatric): Secondary | ICD-10-CM | POA: Diagnosis not present

## 2015-12-23 DIAGNOSIS — Z0000 Encounter for general adult medical examination without abnormal findings: Secondary | ICD-10-CM | POA: Diagnosis not present

## 2015-12-23 DIAGNOSIS — Z7982 Long term (current) use of aspirin: Secondary | ICD-10-CM | POA: Diagnosis not present

## 2015-12-23 DIAGNOSIS — Z23 Encounter for immunization: Secondary | ICD-10-CM | POA: Diagnosis not present

## 2015-12-23 DIAGNOSIS — K219 Gastro-esophageal reflux disease without esophagitis: Secondary | ICD-10-CM | POA: Diagnosis not present

## 2016-01-09 DIAGNOSIS — Z9989 Dependence on other enabling machines and devices: Secondary | ICD-10-CM | POA: Diagnosis not present

## 2016-01-09 DIAGNOSIS — L918 Other hypertrophic disorders of the skin: Secondary | ICD-10-CM | POA: Diagnosis not present

## 2016-01-09 DIAGNOSIS — G4733 Obstructive sleep apnea (adult) (pediatric): Secondary | ICD-10-CM | POA: Diagnosis not present

## 2016-04-28 DIAGNOSIS — S20229A Contusion of unspecified back wall of thorax, initial encounter: Secondary | ICD-10-CM | POA: Diagnosis not present

## 2016-05-03 DIAGNOSIS — J209 Acute bronchitis, unspecified: Secondary | ICD-10-CM | POA: Diagnosis not present

## 2016-05-03 DIAGNOSIS — R05 Cough: Secondary | ICD-10-CM | POA: Diagnosis not present

## 2016-08-07 DIAGNOSIS — J019 Acute sinusitis, unspecified: Secondary | ICD-10-CM | POA: Diagnosis not present

## 2016-08-07 DIAGNOSIS — J028 Acute pharyngitis due to other specified organisms: Secondary | ICD-10-CM | POA: Diagnosis not present

## 2016-08-07 DIAGNOSIS — J4541 Moderate persistent asthma with (acute) exacerbation: Secondary | ICD-10-CM | POA: Diagnosis not present

## 2016-08-07 DIAGNOSIS — H1031 Unspecified acute conjunctivitis, right eye: Secondary | ICD-10-CM | POA: Diagnosis not present

## 2016-08-28 ENCOUNTER — Other Ambulatory Visit (HOSPITAL_COMMUNITY): Payer: Self-pay | Admitting: Respiratory Therapy

## 2016-08-28 DIAGNOSIS — R0600 Dyspnea, unspecified: Secondary | ICD-10-CM

## 2016-08-28 DIAGNOSIS — R0609 Other forms of dyspnea: Secondary | ICD-10-CM

## 2016-08-28 DIAGNOSIS — R062 Wheezing: Secondary | ICD-10-CM

## 2016-08-28 DIAGNOSIS — R05 Cough: Secondary | ICD-10-CM

## 2016-08-28 DIAGNOSIS — R053 Chronic cough: Secondary | ICD-10-CM

## 2016-10-01 ENCOUNTER — Encounter (HOSPITAL_COMMUNITY): Payer: BLUE CROSS/BLUE SHIELD

## 2017-05-09 DIAGNOSIS — J329 Chronic sinusitis, unspecified: Secondary | ICD-10-CM | POA: Diagnosis not present

## 2017-09-05 DIAGNOSIS — Z0000 Encounter for general adult medical examination without abnormal findings: Secondary | ICD-10-CM | POA: Diagnosis not present

## 2017-09-05 DIAGNOSIS — Z125 Encounter for screening for malignant neoplasm of prostate: Secondary | ICD-10-CM | POA: Diagnosis not present

## 2017-09-10 DIAGNOSIS — Z1212 Encounter for screening for malignant neoplasm of rectum: Secondary | ICD-10-CM | POA: Diagnosis not present

## 2017-09-10 DIAGNOSIS — Z0000 Encounter for general adult medical examination without abnormal findings: Secondary | ICD-10-CM | POA: Diagnosis not present

## 2017-09-10 DIAGNOSIS — Z6836 Body mass index (BMI) 36.0-36.9, adult: Secondary | ICD-10-CM | POA: Diagnosis not present

## 2017-09-10 DIAGNOSIS — K219 Gastro-esophageal reflux disease without esophagitis: Secondary | ICD-10-CM | POA: Diagnosis not present

## 2017-09-10 DIAGNOSIS — G4733 Obstructive sleep apnea (adult) (pediatric): Secondary | ICD-10-CM | POA: Diagnosis not present

## 2018-02-26 DIAGNOSIS — H6503 Acute serous otitis media, bilateral: Secondary | ICD-10-CM | POA: Diagnosis not present

## 2018-02-26 DIAGNOSIS — J069 Acute upper respiratory infection, unspecified: Secondary | ICD-10-CM | POA: Diagnosis not present

## 2018-02-26 DIAGNOSIS — H8113 Benign paroxysmal vertigo, bilateral: Secondary | ICD-10-CM | POA: Diagnosis not present

## 2018-02-26 DIAGNOSIS — R110 Nausea: Secondary | ICD-10-CM | POA: Diagnosis not present

## 2018-03-04 DIAGNOSIS — R42 Dizziness and giddiness: Secondary | ICD-10-CM | POA: Diagnosis not present

## 2018-03-04 DIAGNOSIS — J069 Acute upper respiratory infection, unspecified: Secondary | ICD-10-CM | POA: Diagnosis not present

## 2018-03-06 DIAGNOSIS — M9902 Segmental and somatic dysfunction of thoracic region: Secondary | ICD-10-CM | POA: Diagnosis not present

## 2018-03-06 DIAGNOSIS — M9901 Segmental and somatic dysfunction of cervical region: Secondary | ICD-10-CM | POA: Diagnosis not present

## 2018-03-06 DIAGNOSIS — H8190 Unspecified disorder of vestibular function, unspecified ear: Secondary | ICD-10-CM | POA: Diagnosis not present

## 2018-03-06 DIAGNOSIS — R51 Headache: Secondary | ICD-10-CM | POA: Diagnosis not present

## 2018-03-10 DIAGNOSIS — H8190 Unspecified disorder of vestibular function, unspecified ear: Secondary | ICD-10-CM | POA: Diagnosis not present

## 2018-03-10 DIAGNOSIS — R51 Headache: Secondary | ICD-10-CM | POA: Diagnosis not present

## 2018-03-10 DIAGNOSIS — M9901 Segmental and somatic dysfunction of cervical region: Secondary | ICD-10-CM | POA: Diagnosis not present

## 2018-03-10 DIAGNOSIS — M9902 Segmental and somatic dysfunction of thoracic region: Secondary | ICD-10-CM | POA: Diagnosis not present

## 2018-03-18 DIAGNOSIS — R51 Headache: Secondary | ICD-10-CM | POA: Diagnosis not present

## 2018-03-18 DIAGNOSIS — M9901 Segmental and somatic dysfunction of cervical region: Secondary | ICD-10-CM | POA: Diagnosis not present

## 2018-03-18 DIAGNOSIS — H8190 Unspecified disorder of vestibular function, unspecified ear: Secondary | ICD-10-CM | POA: Diagnosis not present

## 2018-03-18 DIAGNOSIS — M9902 Segmental and somatic dysfunction of thoracic region: Secondary | ICD-10-CM | POA: Diagnosis not present

## 2018-03-20 DIAGNOSIS — R51 Headache: Secondary | ICD-10-CM | POA: Diagnosis not present

## 2018-03-20 DIAGNOSIS — M9902 Segmental and somatic dysfunction of thoracic region: Secondary | ICD-10-CM | POA: Diagnosis not present

## 2018-03-20 DIAGNOSIS — H8190 Unspecified disorder of vestibular function, unspecified ear: Secondary | ICD-10-CM | POA: Diagnosis not present

## 2018-03-20 DIAGNOSIS — M9901 Segmental and somatic dysfunction of cervical region: Secondary | ICD-10-CM | POA: Diagnosis not present

## 2018-11-21 DIAGNOSIS — R51 Headache: Secondary | ICD-10-CM | POA: Diagnosis not present

## 2018-11-21 DIAGNOSIS — M9901 Segmental and somatic dysfunction of cervical region: Secondary | ICD-10-CM | POA: Diagnosis not present

## 2018-11-21 DIAGNOSIS — M9902 Segmental and somatic dysfunction of thoracic region: Secondary | ICD-10-CM | POA: Diagnosis not present

## 2018-11-21 DIAGNOSIS — H8190 Unspecified disorder of vestibular function, unspecified ear: Secondary | ICD-10-CM | POA: Diagnosis not present

## 2019-01-28 ENCOUNTER — Other Ambulatory Visit: Payer: Self-pay

## 2019-01-28 DIAGNOSIS — Z20822 Contact with and (suspected) exposure to covid-19: Secondary | ICD-10-CM

## 2019-01-29 LAB — NOVEL CORONAVIRUS, NAA: SARS-CoV-2, NAA: NOT DETECTED

## 2019-02-23 DIAGNOSIS — J019 Acute sinusitis, unspecified: Secondary | ICD-10-CM | POA: Diagnosis not present

## 2019-03-23 DIAGNOSIS — M9901 Segmental and somatic dysfunction of cervical region: Secondary | ICD-10-CM | POA: Diagnosis not present

## 2019-03-23 DIAGNOSIS — M9902 Segmental and somatic dysfunction of thoracic region: Secondary | ICD-10-CM | POA: Diagnosis not present

## 2019-03-23 DIAGNOSIS — H8190 Unspecified disorder of vestibular function, unspecified ear: Secondary | ICD-10-CM | POA: Diagnosis not present

## 2019-03-23 DIAGNOSIS — M791 Myalgia, unspecified site: Secondary | ICD-10-CM | POA: Diagnosis not present

## 2019-05-11 ENCOUNTER — Observation Stay (HOSPITAL_COMMUNITY)
Admission: EM | Admit: 2019-05-11 | Discharge: 2019-05-12 | Disposition: A | Discharge status: Home / Self Care | Payer: BC Managed Care – PPO | Attending: General Surgery | Admitting: General Surgery

## 2019-05-11 ENCOUNTER — Encounter (HOSPITAL_COMMUNITY): Payer: Self-pay | Admitting: Emergency Medicine

## 2019-05-11 ENCOUNTER — Emergency Department (HOSPITAL_COMMUNITY): Payer: BC Managed Care – PPO

## 2019-05-11 ENCOUNTER — Other Ambulatory Visit: Payer: Self-pay

## 2019-05-11 DIAGNOSIS — Z20822 Contact with and (suspected) exposure to covid-19: Secondary | ICD-10-CM | POA: Diagnosis not present

## 2019-05-11 DIAGNOSIS — G4730 Sleep apnea, unspecified: Secondary | ICD-10-CM | POA: Diagnosis not present

## 2019-05-11 DIAGNOSIS — K420 Umbilical hernia with obstruction, without gangrene: Secondary | ICD-10-CM | POA: Diagnosis not present

## 2019-05-11 DIAGNOSIS — E785 Hyperlipidemia, unspecified: Secondary | ICD-10-CM | POA: Diagnosis not present

## 2019-05-11 DIAGNOSIS — K429 Umbilical hernia without obstruction or gangrene: Secondary | ICD-10-CM | POA: Diagnosis not present

## 2019-05-11 DIAGNOSIS — Z6835 Body mass index (BMI) 35.0-35.9, adult: Secondary | ICD-10-CM | POA: Insufficient documentation

## 2019-05-11 DIAGNOSIS — G4733 Obstructive sleep apnea (adult) (pediatric): Secondary | ICD-10-CM | POA: Insufficient documentation

## 2019-05-11 DIAGNOSIS — K219 Gastro-esophageal reflux disease without esophagitis: Secondary | ICD-10-CM | POA: Diagnosis not present

## 2019-05-11 DIAGNOSIS — R109 Unspecified abdominal pain: Secondary | ICD-10-CM | POA: Diagnosis not present

## 2019-05-11 DIAGNOSIS — R1033 Periumbilical pain: Secondary | ICD-10-CM | POA: Diagnosis not present

## 2019-05-11 LAB — CBC WITH DIFFERENTIAL/PLATELET
Abs Immature Granulocytes: 0.03 10*3/uL (ref 0.00–0.07)
Basophils Absolute: 0.1 10*3/uL (ref 0.0–0.1)
Basophils Relative: 1 %
Eosinophils Absolute: 0.2 10*3/uL (ref 0.0–0.5)
Eosinophils Relative: 2 %
HCT: 54.7 % — ABNORMAL HIGH (ref 39.0–52.0)
Hemoglobin: 18.3 g/dL — ABNORMAL HIGH (ref 13.0–17.0)
Immature Granulocytes: 0 %
Lymphocytes Relative: 25 %
Lymphs Abs: 2.1 10*3/uL (ref 0.7–4.0)
MCH: 29.5 pg (ref 26.0–34.0)
MCHC: 33.5 g/dL (ref 30.0–36.0)
MCV: 88.2 fL (ref 80.0–100.0)
Monocytes Absolute: 0.7 10*3/uL (ref 0.1–1.0)
Monocytes Relative: 8 %
Neutro Abs: 5.3 10*3/uL (ref 1.7–7.7)
Neutrophils Relative %: 64 %
Platelets: 360 10*3/uL (ref 150–400)
RBC: 6.2 MIL/uL — ABNORMAL HIGH (ref 4.22–5.81)
RDW: 13.5 % (ref 11.5–15.5)
WBC: 8.3 10*3/uL (ref 4.0–10.5)
nRBC: 0 % (ref 0.0–0.2)

## 2019-05-11 LAB — COMPREHENSIVE METABOLIC PANEL
ALT: 40 U/L (ref 0–44)
AST: 39 U/L (ref 15–41)
Albumin: 5 g/dL (ref 3.5–5.0)
Alkaline Phosphatase: 74 U/L (ref 38–126)
Anion gap: 10 (ref 5–15)
BUN: 13 mg/dL (ref 6–20)
CO2: 26 mmol/L (ref 22–32)
Calcium: 9.6 mg/dL (ref 8.9–10.3)
Chloride: 102 mmol/L (ref 98–111)
Creatinine, Ser: 1.19 mg/dL (ref 0.61–1.24)
GFR calc Af Amer: >60 mL/min (ref >60)
GFR calc non Af Amer: >60 mL/min (ref >60)
Glucose, Bld: 112 mg/dL — ABNORMAL HIGH (ref 70–99)
Potassium: 4.3 mmol/L (ref 3.5–5.1)
Sodium: 138 mmol/L (ref 135–145)
Total Bilirubin: 0.8 mg/dL (ref 0.3–1.2)
Total Protein: 7.7 g/dL (ref 6.5–8.1)

## 2019-05-11 LAB — RESPIRATORY PANEL BY RT PCR (FLU A&B, COVID)
Influenza A by PCR: NEGATIVE
Influenza B by PCR: NEGATIVE
SARS Coronavirus 2 by RT PCR: NEGATIVE

## 2019-05-11 LAB — CREATININE, SERUM
Creatinine, Ser: 1.04 mg/dL (ref 0.61–1.24)
GFR calc Af Amer: >60 mL/min (ref >60)
GFR calc non Af Amer: >60 mL/min (ref >60)

## 2019-05-11 LAB — CBC
HCT: 52.3 % — ABNORMAL HIGH (ref 39.0–52.0)
Hemoglobin: 17.6 g/dL — ABNORMAL HIGH (ref 13.0–17.0)
MCH: 29.3 pg (ref 26.0–34.0)
MCHC: 33.7 g/dL (ref 30.0–36.0)
MCV: 87.2 fL (ref 80.0–100.0)
Platelets: 326 10*3/uL (ref 150–400)
RBC: 6 MIL/uL — ABNORMAL HIGH (ref 4.22–5.81)
RDW: 13.5 % (ref 11.5–15.5)
WBC: 9 10*3/uL (ref 4.0–10.5)
nRBC: 0 % (ref 0.0–0.2)

## 2019-05-11 LAB — URINALYSIS, ROUTINE W REFLEX MICROSCOPIC
Bilirubin Urine: NEGATIVE
Glucose, UA: NEGATIVE mg/dL
Hgb urine dipstick: NEGATIVE
Ketones, ur: NEGATIVE mg/dL
Leukocytes,Ua: NEGATIVE
Nitrite: NEGATIVE
Protein, ur: NEGATIVE mg/dL
Specific Gravity, Urine: 1.016 (ref 1.005–1.030)
pH: 6 (ref 5.0–8.0)

## 2019-05-11 LAB — HIV ANTIBODY (ROUTINE TESTING W REFLEX): HIV Screen 4th Generation wRfx: NONREACTIVE

## 2019-05-11 LAB — LIPASE, BLOOD: Lipase: 32 U/L (ref 11–51)

## 2019-05-11 MED ORDER — SCOPOLAMINE 1 MG/3DAYS TD PT72
1.0000 | MEDICATED_PATCH | TRANSDERMAL | Status: DC
  Administered 2019-05-12: 1.5 mg via TRANSDERMAL
  Filled 2019-05-11: qty 1

## 2019-05-11 MED ORDER — METOPROLOL TARTRATE 5 MG/5ML IV SOLN: 5.0000 mg | Freq: Four times a day (QID) | INTRAVENOUS | Status: DC | PRN

## 2019-05-11 MED ORDER — KCL IN DEXTROSE-NACL 20-5-0.45 MEQ/L-%-% IV SOLN
INTRAVENOUS | Status: DC
  Filled 2019-05-11 (×2): qty 1000

## 2019-05-11 MED ORDER — ENSURE PRE-SURGERY PO LIQD
296.0000 mL | Freq: Once | ORAL | Status: AC
  Administered 2019-05-12: 296 mL via ORAL
  Filled 2019-05-11: qty 296

## 2019-05-11 MED ORDER — HYDROMORPHONE HCL 1 MG/ML IJ SOLN
1.0000 mg | Freq: Once | INTRAMUSCULAR | Status: AC
  Administered 2019-05-11: 1 mg via INTRAVENOUS
  Filled 2019-05-11 (×2): qty 1

## 2019-05-11 MED ORDER — ONDANSETRON HCL 4 MG/2ML IJ SOLN: 4.0000 mg | Freq: Four times a day (QID) | INTRAMUSCULAR | Status: DC | PRN

## 2019-05-11 MED ORDER — ACETAMINOPHEN 500 MG PO TABS
1000.0000 mg | ORAL_TABLET | ORAL | Status: AC
  Administered 2019-05-12: 1000 mg via ORAL
  Filled 2019-05-11: qty 2

## 2019-05-11 MED ORDER — KETOROLAC TROMETHAMINE 15 MG/ML IJ SOLN: 15.0000 mg | Freq: Four times a day (QID) | INTRAMUSCULAR | Status: DC | PRN

## 2019-05-11 MED ORDER — ENOXAPARIN SODIUM 40 MG/0.4ML ~~LOC~~ SOLN
40.0000 mg | SUBCUTANEOUS | Status: DC
  Administered 2019-05-11: 40 mg via SUBCUTANEOUS
  Filled 2019-05-11: qty 0.4

## 2019-05-11 MED ORDER — OXYCODONE HCL 5 MG PO TABS: 5.0000 mg | ORAL_TABLET | ORAL | Status: DC | PRN

## 2019-05-11 MED ORDER — GABAPENTIN 300 MG PO CAPS
300.0000 mg | ORAL_CAPSULE | ORAL | Status: AC
  Administered 2019-05-12: 300 mg via ORAL
  Filled 2019-05-11: qty 1

## 2019-05-11 MED ORDER — HYDROMORPHONE HCL 1 MG/ML IJ SOLN: 0.5000 mg | INTRAMUSCULAR | Status: DC | PRN

## 2019-05-11 MED ORDER — IOHEXOL 300 MG/ML  SOLN
100.0000 mL | Freq: Once | INTRAMUSCULAR | Status: AC | PRN
  Administered 2019-05-11: 100 mL via INTRAVENOUS

## 2019-05-11 MED ORDER — ONDANSETRON HCL 4 MG/2ML IJ SOLN
4.0000 mg | Freq: Once | INTRAMUSCULAR | Status: AC
  Administered 2019-05-11: 4 mg via INTRAVENOUS
  Filled 2019-05-11 (×2): qty 2

## 2019-05-11 MED ORDER — KETOROLAC TROMETHAMINE 15 MG/ML IJ SOLN
15.0000 mg | INTRAMUSCULAR | Status: AC
  Administered 2019-05-12: 15 mg via INTRAVENOUS
  Filled 2019-05-11: qty 1

## 2019-05-11 MED ORDER — CEFAZOLIN SODIUM-DEXTROSE 2-4 GM/100ML-% IV SOLN
2.0000 g | Freq: Once | INTRAVENOUS | Status: DC
  Filled 2019-05-11 (×2): qty 100

## 2019-05-11 MED ORDER — ONDANSETRON 4 MG PO TBDP: 4.0000 mg | ORAL_TABLET | Freq: Four times a day (QID) | ORAL | Status: DC | PRN

## 2019-05-11 MED ORDER — ACETAMINOPHEN 500 MG PO TABS
1000.0000 mg | ORAL_TABLET | Freq: Four times a day (QID) | ORAL | Status: DC
  Administered 2019-05-11 – 2019-05-12 (×3): 1000 mg via ORAL
  Filled 2019-05-11 (×3): qty 2

## 2019-05-11 NOTE — ED Provider Notes (Signed)
[facility] EMERGENCY DEPARTMENT Provider Note   CSN: [id number] Arrival date & time: 05/11/19  1032     History Chief Complaint  Patient presents with  . Abdominal Pain  . Hernia    Ross Moore is a 59 y.o. male with a past medical history significant for GERD, testosterone deficiency on chronic testosterone and hyperlipidemia who presents to the ED due to abdominal pain located around his umbilicus x4 days.  Patient was seen by PCP today and was sent to the ED due to a potential incarcerated umbilical hernia.  Patient states he has had a hernia for some time that has gradually grown in size over the past few months.  Patient states he started having severe pain located around his umbilicus Friday night into Saturday morning associated with nausea and 3-4 daily episodes of nonbloody diarrhea.  Patient denies emesis.  Patient notes pain is worse with movement.  He has tried aspirin for pain with mild relief.  Patient denies melena, hematochezia, and hematemesis.  Patient denies fever and chills.  Patient denies past abdominal surgeries.  Denies urinary symptoms.      Past Medical History:  Diagnosis Date  . ALLERGIC RHINITIS 12/30/2006  . ANXIETY 12/30/2006  . Atypical chest pain 03/14/2011   met test,stress myoview 01/10/2012-no ischemia  . Chronic cough onset Dec 2010  . GERD (gastroesophageal reflux disease)    Post EGD per pt Lajoyce Corners)  . Hx of viral pericarditis    negataive ECHO 01/09/2012- there had been ?hx. pericardtitis  . HYPERGLYCEMIA 12/30/2006  . HYPERLIPIDEMIA 12/30/2006  . OSA on CPAP   . PITUITARY INSUFFICIENCY 04/08/2008  . TESTOSTERONE DEFICIENCY 12/30/2006    Patient Active Problem List   Diagnosis Date Noted  . Incarcerated umbilical hernia 53/66/4403  . Encounter for long-term (current) use of other medications 01/26/2011  . BPH (benign prostatic hyperplasia) 10/02/2010  . OBSTRUCTIVE SLEEP APNEA 05/05/2009  . COUGH 05/05/2009  .  PITUITARY INSUFFICIENCY 04/08/2008  . TESTOSTERONE DEFICIENCY 12/30/2006  . HYPERLIPIDEMIA 12/30/2006  . ANXIETY 12/30/2006  . ALLERGIC RHINITIS 12/30/2006  . HYPERGLYCEMIA 12/30/2006    Past Surgical History:  Procedure Laterality Date  . BREAST REDUCTION SURGERY  2004  . Stress Cardiolite  01/03/1999  . [place]  2003       Family History  Problem Relation Age of Onset  . Asthma Mother   . Hypertension Mother   . Hyperlipidemia Mother     Social History   Tobacco Use  . Smoking status: Never Smoker  . Smokeless tobacco: Never Used  Substance Use Topics  . Alcohol use: Yes    Comment: 2-3 times per wk  . Drug use: No    Home Medications Prior to Admission medications   Medication Sig Start Date End Date Taking? Authorizing Provider  Ascorbic Acid (VITAMIN C) 100 MG tablet Take 100 mg by mouth daily.   Yes [provider]  Multiple Vitamin (MULTIVITAMIN) tablet Take 1 tablet by mouth daily.   Yes [provider]  pantoprazole (PROTONIX) 40 MG tablet Take 40 mg by mouth 2 (two) times daily.   Yes [provider]  rosuvastatin (CRESTOR) 20 MG tablet Take 20 mg by mouth daily.    Yes [provider]  testosterone cypionate (DEPOTESTOTERONE CYPIONATE) 100 MG/ML injection Inject 100 mg into the muscle every 14 (fourteen) days. For IM use only   Yes [provider]  ondansetron (ZOFRAN ODT) 8 MG disintegrating tablet Take 1 tablet (8 mg total)  by mouth every 8 (eight) hours as needed for nausea. Patient not taking: Reported on 06/21/2014 05/14/12   Hairford, Tyler Pita, MD    Allergies    Lipitor [atorvastatin]  Review of Systems   Review of Systems  Constitutional: Negative for chills and fever.  Respiratory: Negative for shortness of breath.   Cardiovascular: Negative for chest pain.  Gastrointestinal: Positive for abdominal distention, abdominal pain, diarrhea and nausea. Negative for vomiting.  Genitourinary: Negative for  dysuria.    Physical Exam Updated Vital Signs BP (!) 151/90 (BP Location: Right Arm)   Pulse 78   Temp 98.2 F (36.8 C) (Oral)   Resp 16   Ht 5' 8" (1.727 m)   Wt 106.6 kg   SpO2 96%   BMI 35.73 kg/m   Physical Exam Vitals and nursing note reviewed.  Constitutional:      General: He is not in acute distress.    Appearance: He is not toxic-appearing.  HENT:     Head: Normocephalic.  Eyes:     Pupils: Pupils are equal, round, and reactive to light.  Cardiovascular:     Rate and Rhythm: Normal rate and regular rhythm.     Pulses: Normal pulses.     Heart sounds: Normal heart sounds. No murmur. No friction rub. No gallop.   Pulmonary:     Effort: Pulmonary effort is normal.     Breath sounds: Normal breath sounds.  Abdominal:     General: Abdomen is flat. Bowel sounds are normal. There is distension.     Tenderness: There is abdominal tenderness. There is guarding. There is no right CVA tenderness, left CVA tenderness or rebound.     Comments: Firm abdomen with possible distention. Tenderness to palpation in epigastric region and around umbilicus. Irreducible umbilical hernia with overlying erythema.   Musculoskeletal:     Cervical back: Neck supple.     Comments: Able to move all 4 extremities without difficulty.   Skin:    General: Skin is warm and dry.  Neurological:     General: No focal deficit present.     Mental Status: He is alert.  Psychiatric:        Mood and Affect: Mood normal.        Behavior: Behavior normal.     ED Results / Procedures / Treatments   Labs (all labs ordered are listed, but only abnormal results are displayed) Labs Reviewed  CBC WITH DIFFERENTIAL/PLATELET - Abnormal; Notable for the following components:      Result Value   RBC 6.20 (*)    Hemoglobin 18.3 (*)    HCT 54.7 (*)    All other components within normal limits  COMPREHENSIVE METABOLIC PANEL - Abnormal; Notable for the following components:   Glucose, Bld 112 (*)    All  other components within normal limits  URINALYSIS, ROUTINE W REFLEX MICROSCOPIC - Abnormal; Notable for the following components:   Color, Urine AMBER (*)    APPearance HAZY (*)    All other components within normal limits  RESPIRATORY PANEL BY RT PCR (FLU A&B, COVID)  LIPASE, BLOOD  HIV ANTIBODY (ROUTINE TESTING W REFLEX)  CBC  CREATININE, SERUM    EKG None  Radiology CT ABDOMEN PELVIS W CONTRAST  Result Date: 05/11/2019 CLINICAL DATA:  Abdominal pain with nausea.  Palpable hernia EXAM: CT ABDOMEN AND PELVIS WITH CONTRAST TECHNIQUE: Multidetector CT imaging of the abdomen and pelvis was performed using the standard protocol following bolus administration of intravenous contrast.  CONTRAST:  130m OMNIPAQUE IOHEXOL 300 MG/ML  SOLN COMPARISON:  May 14, 2012. FINDINGS: Lower chest: There is mild bibasilar atelectasis. No lung base edema or consolidation evident. Hepatobiliary: No focal liver lesions are evident. Gallbladder wall is not appreciably thickened. There is no biliary duct dilatation. Pancreas: There is no pancreatic mass or inflammatory focus. Spleen: No splenic lesions are evident. Adrenals/Urinary Tract: Adrenals bilaterally appear normal. Kidneys bilaterally show no evident mass or hydronephrosis on either side. There is no evident renal or ureteral calculus on either side. Urinary bladder is midline with wall thickness within normal limits. Stomach/Bowel: There are sigmoid and descending colonic diverticula without diverticulitis. There is no appreciable bowel wall or mesenteric thickening. The terminal ileum appears normal. There is no evident bowel obstruction. There is no free air or portal venous air. Vascular/Lymphatic: There is no abdominal aortic aneurysm. No vascular lesions are appreciable. Major venous structures appear patent. There is no evident adenopathy in the abdomen or pelvis. Reproductive: Prostate and seminal vesicles are normal in size and contour. No pelvic mass  evident. Other: There is a midline ventral hernia at the umbilical level which contains fat and soft tissue thickening consistent with a degree of panniculitis. There is no bowel extending into this hernia. At the hernia neck, the diameter from right to left dimension measures 1.4 cm. The neck of this hernia measures 1.3 cm from superior to inferior dimension. Appendix appears normal. There is no abscess or ascites in the abdomen or pelvis. Musculoskeletal: There are foci of degenerative change in the lower thoracic and lumbar regions. There are no blastic or lytic bone lesions. No intramuscular lesions are appreciable. IMPRESSION: 1. Ventral hernia at the level of the umbilicus which contains fat and a degree of panniculitis but no bowel. 2. There are sigmoid and descending colonic diverticula without diverticulitis. No bowel obstruction. No abscess in the abdomen or pelvis. Appendix appears normal. 3. No renal or ureteral calculi. No hydronephrosis. Urinary bladder wall thickness normal. Electronically Signed   By: WLowella GripIII M.D.   On: 05/11/2019 13:30    Procedures Procedures (including critical care time)  Medications Ordered in ED Medications  enoxaparin (LOVENOX) injection 40 mg (has no administration in time range)  dextrose 5 % and 0.45 % NaCl with KCl 20 mEq/L infusion (has no administration in time range)  ceFAZolin (ANCEF) IVPB 2g/100 mL premix (has no administration in time range)  ondansetron (ZOFRAN-ODT) disintegrating tablet 4 mg (has no administration in time range)    Or  ondansetron (ZOFRAN) injection 4 mg (has no administration in time range)  metoprolol tartrate (LOPRESSOR) injection 5 mg (has no administration in time range)  acetaminophen (TYLENOL) tablet 1,000 mg (has no administration in time range)  ketorolac (TORADOL) 15 MG/ML injection 15 mg (has no administration in time range)  oxyCODONE (Oxy IR/ROXICODONE) immediate release tablet 5-10 mg (has no  administration in time range)  HYDROmorphone (DILAUDID) injection 0.5-1 mg (has no administration in time range)  ondansetron (ZOFRAN) injection 4 mg (4 mg Intravenous Given 05/11/19 1411)  HYDROmorphone (DILAUDID) injection 1 mg (1 mg Intravenous Given 05/11/19 1411)  iohexol (OMNIPAQUE) 300 MG/ML solution 100 mL (100 mLs Intravenous Contrast Given 05/11/19 1310)    ED Course  I have reviewed the triage vital signs and the nursing notes.  Pertinent labs & imaging results that were available during my care of the patient were reviewed by me and considered in my medical decision making (see chart for details).  Clinical Course as  of May 10 1446  Mon May 11, 2019  1335 Spoke to Claiborne Billings, [place] with general surgery who agrees to evaluate patient at bedside.    [CA]    Clinical Course User Index [CA] Karie Kirks   MDM Rules/Calculators/A&P                     59 year old male presents to the ED due to abdominal pain associated with nausea and diarrhea x4 days.  Patient was sent here by PCP to rule out an incarcerated hernia. Patient denies fever and chills.  Stable vitals.  Patient is afebrile, not tachycardic or hypoxic.  Patient in no acute distress and nontoxic-appearing. Abdomen firm, mildly distended with tenderness to palpation in epigastric region and around umbilicus. Irreducible umbilical hernia with overlying erythema. Will obtain routine labs and CT abdomen to better evaluate hernia.   CBC reassuring with no leukocytosis. CMP significant for mild hyperglycemia at 112, but otherwise unremarkable. Lipase normal at 32. Doubt pancreatitis. UA negative for signs of infection. CT personally reviewed which demonstrates: 1. Ventral hernia at the level of the umbilicus which contains fat  and a degree of panniculitis but no bowel.    2. There are sigmoid and descending colonic diverticula without  diverticulitis. No bowel obstruction. No abscess in the abdomen or  pelvis. Appendix  appears normal.    3. No renal or ureteral calculi. No hydronephrosis. Urinary bladder  wall thickness normal.   Will consult general surgery. Spoke to Swede Heaven with general surgery who agrees to admit patient for possible surgery tomorrow AM. COVID test ordered.  Final Clinical Impression(s) / ED Diagnoses Final diagnoses:  None    Rx / DC Orders ED Discharge Orders    None       Karie Kirks 05/11/19 1449    Valarie Merino, MD 05/15/19 [phone]

## 2019-05-11 NOTE — ED Notes (Signed)
PT transported to floor Attempted report X 3

## 2019-05-11 NOTE — ED Notes (Signed)
Surgery team at bedside

## 2019-05-11 NOTE — Progress Notes (Signed)
Patient refused CPAP for tonight. RT instructed patient to have RN call RT if he changes his mind. RT will monitor as needed. 

## 2019-05-11 NOTE — ED Triage Notes (Signed)
Pt sent by PCP. Pt started having abd pain Saturday with nausea. Hx of hernia but doctor office was not able to reduce it.

## 2019-05-11 NOTE — ED Notes (Signed)
General Surgery team at bedside.

## 2019-05-11 NOTE — ED Notes (Signed)
Attempted report X2 

## 2019-05-11 NOTE — H&P (Addendum)
Ross Moore 1960/09/03  [id number].    Chief Complaint/Reason for Consult: incarcerated umbilical hernia  HPI:  This is a pleasant 59 yo white male with a history of OSA using CPAP and hyperlipidemia.  He has had an umbilical hernia for about a year.  It has never caused him a problem until several days ago.  In November he had bronchitis and noticed with all the coughing he did that his "lump" at his umbilicus got a little bigger, but still was not painful.  This past weekend the patient developed abdominal pain at his umbilicus.  He has not had any N/V, but has had some diarrhea.  He has otherwise had a normal oral intake.  He denies any fevers or chills.  He denies SOB, CP, or any other symptoms except periumbilical abdominal pain.  He presented to his PCP office today who referred him to the ED because of this hernia.  Her has normal labs here, but a CT scan that revealed an incarcerated fat containing umbilical hernia with some surrounding cellulitis.  We have been asked to see for further evaluation and management.  ROS: ROS: Please see HPI, otherwise all other systems have been reviewed and are negative.  Family History  Problem Relation Age of Onset  . Asthma Mother   . Hypertension Mother   . Hyperlipidemia Mother     Past Medical History:  Diagnosis Date  . ALLERGIC RHINITIS 12/30/2006  . ANXIETY 12/30/2006  . Atypical chest pain 03/14/2011   met test,stress myoview 01/10/2012-no ischemia  . Chronic cough onset Dec 2010  . GERD (gastroesophageal reflux disease)    Post EGD per pt Lajoyce Corners)  . Hx of viral pericarditis    negataive ECHO 01/09/2012- there had been ?hx. pericardtitis  . HYPERGLYCEMIA 12/30/2006  . HYPERLIPIDEMIA 12/30/2006  . OSA on CPAP   . PITUITARY INSUFFICIENCY 04/08/2008  . TESTOSTERONE DEFICIENCY 12/30/2006    Past Surgical History:  Procedure Laterality Date  . BREAST REDUCTION SURGERY  2004  . Stress Cardiolite  01/03/1999  . [place]  2003      Social History:  reports that he has never smoked. He has never used smokeless tobacco. He reports current alcohol use. He reports that he does not use drugs.  Allergies:  Allergies  Allergen Reactions  . Lipitor [Atorvastatin] Other (See Comments)    Achy feeling in muscles    (Not in a hospital admission)    Physical Exam: Blood pressure (!) 151/90, pulse 78, temperature 98.2 F (36.8 C), temperature source Oral, resp. rate 16, height _0  (1.727 m), weight 106.6 kg, SpO2 96 %. General: pleasant, WD, WN white male who is laying in bed in NAD HEENT: head is normocephalic, atraumatic.  Sclera are noninjected.  PERRL.  Ears and nose without any masses or lesions.  Mouth is pink and moist Heart: regular, rate, and rhythm.  Normal s1,s2. No obvious murmurs, gallops, or rubs noted.  Palpable radial and pedal pulses bilaterally Lungs: CTAB, no wheezes, rhonchi, or rales noted.  Respiratory effort nonlabored Abd: soft, NT, except periumbilical.   He has some periumbilical cellulitis and an incarcerated umbilical hernia.  This is unable to be reduced despite several attempts, ND, +BS, no masses or organomegaly MS: all 4 extremities are symmetrical with no cyanosis, clubbing, or edema. Skin: warm and dry with no masses, lesions, or rashes Neuro: Cranial nerves 2-12 grossly intact, speech is normal Psych: A&Ox3 with an appropriate affect.   Results for orders  placed or performed during the hospital encounter of 05/11/19 (from the past 48 hour(s))  Urinalysis, Routine w reflex microscopic     Status: Abnormal   Collection Time: 05/11/19 11:16 AM  Result Value Ref Range   Color, Urine AMBER (A) YELLOW    Comment: BIOCHEMICALS MAY BE AFFECTED BY COLOR   APPearance HAZY (A) CLEAR   Specific Gravity, Urine 1.016 1.005 - 1.030   pH 6.0 5.0 - 8.0   Glucose, UA NEGATIVE NEGATIVE mg/dL   Hgb urine dipstick NEGATIVE NEGATIVE   Bilirubin Urine NEGATIVE NEGATIVE   Ketones, ur NEGATIVE  NEGATIVE mg/dL   Protein, ur NEGATIVE NEGATIVE mg/dL   Nitrite NEGATIVE NEGATIVE   Leukocytes,Ua NEGATIVE NEGATIVE    Comment: Performed at [facility] [address], [place], [place] [address]  CBC with Differential     Status: Abnormal   Collection Time: 05/11/19 11:52 AM  Result Value Ref Range   WBC 8.3 4.0 - 10.5 K/uL   RBC 6.20 (H) 4.22 - 5.81 MIL/uL   Hemoglobin 18.3 (H) 13.0 - 17.0 g/dL   HCT 54.7 (H) 39.0 - 52.0 %   MCV 88.2 80.0 - 100.0 fL   MCH 29.5 26.0 - 34.0 pg   MCHC 33.5 30.0 - 36.0 g/dL   RDW 13.5 11.5 - 15.5 %   Platelets 360 150 - 400 K/uL   nRBC 0.0 0.0 - 0.2 %   Neutrophils Relative % 64 %   Neutro Abs 5.3 1.7 - 7.7 K/uL   Lymphocytes Relative 25 %   Lymphs Abs 2.1 0.7 - 4.0 K/uL   Monocytes Relative 8 %   Monocytes Absolute 0.7 0.1 - 1.0 K/uL   Eosinophils Relative 2 %   Eosinophils Absolute 0.2 0.0 - 0.5 K/uL   Basophils Relative 1 %   Basophils Absolute 0.1 0.0 - 0.1 K/uL   Immature Granulocytes 0 %   Abs Immature Granulocytes 0.03 0.00 - 0.07 K/uL    Comment: Performed at [facility] [address] [place], [place] [address]  Comprehensive metabolic panel     Status: Abnormal   Collection Time: 05/11/19 11:52 AM  Result Value Ref Range   Sodium 138 135 - 145 mmol/L   Potassium 4.3 3.5 - 5.1 mmol/L   Chloride 102 98 - 111 mmol/L   CO2 26 22 - 32 mmol/L   Glucose, Bld 112 (H) 70 - 99 mg/dL   BUN 13 6 - 20 mg/dL   Creatinine, Ser 1.19 0.61 - 1.24 mg/dL   Calcium 9.6 8.9 - 10.3 mg/dL   Total Protein 7.7 6.5 - 8.1 g/dL   Albumin 5.0 3.5 - 5.0 g/dL   AST 39 15 - 41 U/L   ALT 40 0 - 44 U/L   Alkaline Phosphatase 74 38 - 126 U/L   Total Bilirubin 0.8 0.3 - 1.2 mg/dL   GFR calc non Af Amer >60 >60 mL/min   GFR calc Af Amer >60 >60 mL/min   Anion gap 10 5 - 15    Comment: Performed at [facility] [address], [place], [place] [address]  Lipase, blood     Status: None   Collection Time: 05/11/19 11:52 AM  Result Value  Ref Range   Lipase 32 11 - 51 U/L    Comment: Performed at [facility] [address], [place], [place] [address]   CT ABDOMEN PELVIS W CONTRAST  Result Date: 05/11/2019 CLINICAL DATA:  Abdominal pain  with nausea.  Palpable hernia EXAM: CT ABDOMEN AND PELVIS WITH CONTRAST TECHNIQUE: Multidetector CT imaging of the abdomen and pelvis was performed using the standard protocol following bolus administration of intravenous contrast. CONTRAST:  188m OMNIPAQUE IOHEXOL 300 MG/ML  SOLN COMPARISON:  May 14, 2012. FINDINGS: Lower chest: There is mild bibasilar atelectasis. No lung base edema or consolidation evident. Hepatobiliary: No focal liver lesions are evident. Gallbladder wall is not appreciably thickened. There is no biliary duct dilatation. Pancreas: There is no pancreatic mass or inflammatory focus. Spleen: No splenic lesions are evident. Adrenals/Urinary Tract: Adrenals bilaterally appear normal. Kidneys bilaterally show no evident mass or hydronephrosis on either side. There is no evident renal or ureteral calculus on either side. Urinary bladder is midline with wall thickness within normal limits. Stomach/Bowel: There are sigmoid and descending colonic diverticula without diverticulitis. There is no appreciable bowel wall or mesenteric thickening. The terminal ileum appears normal. There is no evident bowel obstruction. There is no free air or portal venous air. Vascular/Lymphatic: There is no abdominal aortic aneurysm. No vascular lesions are appreciable. Major venous structures appear patent. There is no evident adenopathy in the abdomen or pelvis. Reproductive: Prostate and seminal vesicles are normal in size and contour. No pelvic mass evident. Other: There is a midline ventral hernia at the umbilical level which contains fat and soft tissue thickening consistent with a degree of panniculitis. There is no bowel extending into this hernia. At the hernia neck, the diameter from right to left  dimension measures 1.4 cm. The neck of this hernia measures 1.3 cm from superior to inferior dimension. Appendix appears normal. There is no abscess or ascites in the abdomen or pelvis. Musculoskeletal: There are foci of degenerative change in the lower thoracic and lumbar regions. There are no blastic or lytic bone lesions. No intramuscular lesions are appreciable. IMPRESSION: 1. Ventral hernia at the level of the umbilicus which contains fat and a degree of panniculitis but no bowel. 2. There are sigmoid and descending colonic diverticula without diverticulitis. No bowel obstruction. No abscess in the abdomen or pelvis. Appendix appears normal. 3. No renal or ureteral calculi. No hydronephrosis. Urinary bladder wall thickness normal. Electronically Signed   By: WLowella GripIII M.D.   On: 05/11/2019 13:30      Assessment/Plan HLD OSA - wears CPAP at night  Incarcerated fat containing umbilical hernia The patient has an incarcerated umbilical hernia.  No evidence of small bowel or intestine on his CT scan.   This is unable to be reduced despite Trendelenburg, ice, and pain medication.  He has some cellulitis around this hernia and therefore, would benefit from admission for repair to avoid further possible infection.  We will order his COVID test and plan to repair this tomorrow morning with expectation of discharge later tomorrow.  This has been discussed with the patient and he agrees with this plan.   FEN - regular diet, NPO p 0430 per ERAS VTE - lovenox ID - ancef on call to OR Admit: observation   KHenreitta Cea [facility] 05/11/2019, 2:56 PM Please see Amion for pager number during day hours 7:00am-4:30pm or 7:00am -11:30am on weekends

## 2019-05-11 NOTE — Anesthesia Preprocedure Evaluation (Addendum)
Anesthesia Evaluation  Patient identified by MRN, date of birth, ID band Patient awake    Reviewed: Allergy & Precautions, NPO status , Patient's Chart, lab work & pertinent test results  History of Anesthesia Complications Negative for: history of anesthetic complications  Airway Mallampati: I  TM Distance: >3 FB Neck ROM: Full    Dental  (+) Dental Advisory Given, Teeth Intact   Pulmonary sleep apnea and Continuous Positive Airway Pressure Ventilation ,    breath sounds clear to auscultation       Cardiovascular negative cardio ROS   Rhythm:Regular Rate:Normal     Neuro/Psych Anxiety negative neurological ROS     GI/Hepatic Neg liver ROS, GERD  Medicated and Controlled,  Endo/Other  Morbid obesityPituitary insufficiency  Renal/GU negative Renal ROS     Musculoskeletal   Abdominal (+) + obese,   Peds  Hematology negative hematology ROS (+)   Anesthesia Other Findings   Reproductive/Obstetrics                            Anesthesia Physical Anesthesia Plan  ASA: III  Anesthesia Plan: General   Post-op Pain Management:    Induction: Intravenous  PONV Risk Score and Plan: 2 and Ondansetron and Dexamethasone  Airway Management Planned: Oral ETT  Additional Equipment: None  Intra-op Plan:   Post-operative Plan: Extubation in OR  Informed Consent: I have reviewed the patients History and Physical, chart, labs and discussed the procedure including the risks, benefits and alternatives for the proposed anesthesia with the patient or authorized representative who has indicated his/her understanding and acceptance.     Dental advisory given  Plan Discussed with: CRNA and Surgeon  Anesthesia Plan Comments:        Anesthesia Quick Evaluation

## 2019-05-11 NOTE — ED Notes (Addendum)
Pt states he doesn't want pain or nausea medicine at this time. Returned to med cabinet and told to let this RN know if he needed medication later.

## 2019-05-12 ENCOUNTER — Encounter (HOSPITAL_COMMUNITY): Payer: Self-pay | Admitting: General Surgery

## 2019-05-12 ENCOUNTER — Observation Stay (HOSPITAL_COMMUNITY): Payer: BC Managed Care – PPO | Admitting: Anesthesiology

## 2019-05-12 ENCOUNTER — Encounter (HOSPITAL_COMMUNITY)
Admission: EM | Disposition: A | Discharge status: Home / Self Care | Payer: Self-pay | Source: Home / Self Care | Attending: Emergency Medicine

## 2019-05-12 DIAGNOSIS — K420 Umbilical hernia with obstruction, without gangrene: Secondary | ICD-10-CM | POA: Diagnosis not present

## 2019-05-12 DIAGNOSIS — G4733 Obstructive sleep apnea (adult) (pediatric): Secondary | ICD-10-CM | POA: Diagnosis not present

## 2019-05-12 DIAGNOSIS — Z9989 Dependence on other enabling machines and devices: Secondary | ICD-10-CM | POA: Diagnosis not present

## 2019-05-12 DIAGNOSIS — E785 Hyperlipidemia, unspecified: Secondary | ICD-10-CM | POA: Diagnosis not present

## 2019-05-12 HISTORY — PX: UMBILICAL HERNIA REPAIR: SHX196

## 2019-05-12 LAB — BASIC METABOLIC PANEL
Anion gap: 8 (ref 5–15)
BUN: 13 mg/dL (ref 6–20)
CO2: 28 mmol/L (ref 22–32)
Calcium: 9.2 mg/dL (ref 8.9–10.3)
Chloride: 103 mmol/L (ref 98–111)
Creatinine, Ser: 1.14 mg/dL (ref 0.61–1.24)
GFR calc Af Amer: >60 mL/min (ref >60)
GFR calc non Af Amer: >60 mL/min (ref >60)
Glucose, Bld: 105 mg/dL — ABNORMAL HIGH (ref 70–99)
Potassium: 4.2 mmol/L (ref 3.5–5.1)
Sodium: 139 mmol/L (ref 135–145)

## 2019-05-12 LAB — CBC
HCT: 50.1 % (ref 39.0–52.0)
Hemoglobin: 16.8 g/dL (ref 13.0–17.0)
MCH: 29.4 pg (ref 26.0–34.0)
MCHC: 33.5 g/dL (ref 30.0–36.0)
MCV: 87.7 fL (ref 80.0–100.0)
Platelets: 322 10*3/uL (ref 150–400)
RBC: 5.71 MIL/uL (ref 4.22–5.81)
RDW: 13.5 % (ref 11.5–15.5)
WBC: 8.1 10*3/uL (ref 4.0–10.5)
nRBC: 0 % (ref 0.0–0.2)

## 2019-05-12 LAB — SURGICAL PCR SCREEN
MRSA, PCR: NEGATIVE
Staphylococcus aureus: NEGATIVE

## 2019-05-12 SURGERY — REPAIR, HERNIA, UMBILICAL, ADULT
Anesthesia: General | Site: Abdomen

## 2019-05-12 MED ORDER — PROPOFOL 10 MG/ML IV BOLUS
INTRAVENOUS | Status: AC
  Filled 2019-05-12: qty 20

## 2019-05-12 MED ORDER — MIDAZOLAM HCL 2 MG/2ML IJ SOLN
INTRAMUSCULAR | Status: AC
  Filled 2019-05-12: qty 2

## 2019-05-12 MED ORDER — ROCURONIUM BROMIDE 10 MG/ML (PF) SYRINGE
PREFILLED_SYRINGE | INTRAVENOUS | Status: DC | PRN
  Administered 2019-05-12: 80 mg via INTRAVENOUS

## 2019-05-12 MED ORDER — SUGAMMADEX SODIUM 200 MG/2ML IV SOLN
INTRAVENOUS | Status: DC | PRN
  Administered 2019-05-12: 200 mg via INTRAVENOUS

## 2019-05-12 MED ORDER — STERILE WATER FOR IRRIGATION IR SOLN
Status: DC | PRN
  Administered 2019-05-12: 1000 mL

## 2019-05-12 MED ORDER — MIDAZOLAM HCL 2 MG/2ML IJ SOLN: 0.5000 mg | Freq: Once | INTRAMUSCULAR | Status: DC | PRN

## 2019-05-12 MED ORDER — ONDANSETRON HCL 4 MG/2ML IJ SOLN
INTRAMUSCULAR | Status: DC | PRN
  Administered 2019-05-12: 4 mg via INTRAVENOUS

## 2019-05-12 MED ORDER — MEPERIDINE HCL 25 MG/ML IJ SOLN: 6.2500 mg | INTRAMUSCULAR | Status: DC | PRN

## 2019-05-12 MED ORDER — PROMETHAZINE HCL 25 MG/ML IJ SOLN: 6.2500 mg | INTRAMUSCULAR | Status: DC | PRN

## 2019-05-12 MED ORDER — LIDOCAINE 2% (20 MG/ML) 5 ML SYRINGE
INTRAMUSCULAR | Status: AC
  Filled 2019-05-12: qty 5

## 2019-05-12 MED ORDER — FENTANYL CITRATE (PF) 250 MCG/5ML IJ SOLN
INTRAMUSCULAR | Status: AC
  Filled 2019-05-12: qty 5

## 2019-05-12 MED ORDER — LACTATED RINGERS IV SOLN: INTRAVENOUS | Status: DC

## 2019-05-12 MED ORDER — FENTANYL CITRATE (PF) 100 MCG/2ML IJ SOLN
INTRAMUSCULAR | Status: DC | PRN
  Administered 2019-05-12: 100 ug via INTRAVENOUS

## 2019-05-12 MED ORDER — KCL IN DEXTROSE-NACL 20-5-0.45 MEQ/L-%-% IV SOLN: INTRAVENOUS | Status: DC

## 2019-05-12 MED ORDER — ROCURONIUM BROMIDE 10 MG/ML (PF) SYRINGE
PREFILLED_SYRINGE | INTRAVENOUS | Status: AC
  Filled 2019-05-12: qty 10

## 2019-05-12 MED ORDER — LIDOCAINE 2% (20 MG/ML) 5 ML SYRINGE
INTRAMUSCULAR | Status: DC | PRN
  Administered 2019-05-12: 40 mg via INTRAVENOUS

## 2019-05-12 MED ORDER — DEXAMETHASONE SODIUM PHOSPHATE 10 MG/ML IJ SOLN
INTRAMUSCULAR | Status: DC | PRN
  Administered 2019-05-12: 10 mg via INTRAVENOUS

## 2019-05-12 MED ORDER — OXYCODONE HCL 5 MG PO TABS: 5.0000 mg | ORAL_TABLET | ORAL | 0 refills | Status: DC | PRN

## 2019-05-12 MED ORDER — HYDROMORPHONE HCL 1 MG/ML IJ SOLN: 0.2500 mg | INTRAMUSCULAR | Status: DC | PRN

## 2019-05-12 MED ORDER — BUPIVACAINE HCL (PF) 0.25 % IJ SOLN
INTRAMUSCULAR | Status: DC | PRN
  Administered 2019-05-12: 9 mL

## 2019-05-12 MED ORDER — ONDANSETRON HCL 4 MG/2ML IJ SOLN
INTRAMUSCULAR | Status: AC
  Filled 2019-05-12: qty 2

## 2019-05-12 MED ORDER — 0.9 % SODIUM CHLORIDE (POUR BTL) OPTIME
TOPICAL | Status: DC | PRN
  Administered 2019-05-12: 1000 mL

## 2019-05-12 MED ORDER — PANTOPRAZOLE SODIUM 40 MG PO TBEC
40.0000 mg | DELAYED_RELEASE_TABLET | Freq: Two times a day (BID) | ORAL | Status: DC
  Administered 2019-05-12: 40 mg via ORAL
  Filled 2019-05-12: qty 1

## 2019-05-12 MED ORDER — MIDAZOLAM HCL 5 MG/5ML IJ SOLN
INTRAMUSCULAR | Status: DC | PRN
  Administered 2019-05-12: 2 mg via INTRAVENOUS

## 2019-05-12 MED ORDER — DEXAMETHASONE SODIUM PHOSPHATE 10 MG/ML IJ SOLN
INTRAMUSCULAR | Status: AC
  Filled 2019-05-12: qty 1

## 2019-05-12 MED ORDER — PROPOFOL 10 MG/ML IV BOLUS
INTRAVENOUS | Status: DC | PRN
  Administered 2019-05-12: 150 mg via INTRAVENOUS

## 2019-05-12 MED ORDER — SCOPOLAMINE 1 MG/3DAYS TD PT72
1.0000 | MEDICATED_PATCH | Freq: Once | TRANSDERMAL | Status: DC
  Administered 2019-05-12: 1.5 mg via TRANSDERMAL

## 2019-05-12 MED ORDER — BUPIVACAINE HCL (PF) 0.25 % IJ SOLN
INTRAMUSCULAR | Status: AC
  Filled 2019-05-12: qty 30

## 2019-05-12 SURGICAL SUPPLY — 39 items
BLADE CLIPPER SURG (BLADE) IMPLANT
CANISTER SUCT 3000ML PPV (MISCELLANEOUS) IMPLANT
CHLORAPREP W/TINT 26 (MISCELLANEOUS) ×2 IMPLANT
COVER SURGICAL LIGHT HANDLE (MISCELLANEOUS) ×2 IMPLANT
COVER WAND RF STERILE (DRAPES) ×2 IMPLANT
DECANTER SPIKE VIAL GLASS SM (MISCELLANEOUS) ×2 IMPLANT
DERMABOND ADVANCED (GAUZE/BANDAGES/DRESSINGS) ×1
DERMABOND ADVANCED .7 DNX12 (GAUZE/BANDAGES/DRESSINGS) ×1 IMPLANT
DRAPE LAPAROSCOPIC ABDOMINAL (DRAPES) ×2 IMPLANT
ELECT CAUTERY BLADE 6.4 (BLADE) ×2 IMPLANT
ELECT REM PT RETURN 9FT ADLT (ELECTROSURGICAL) ×2
ELECTRODE REM PT RTRN 9FT ADLT (ELECTROSURGICAL) ×1 IMPLANT
GAUZE 4X4 16PLY RFD (DISPOSABLE) ×2 IMPLANT
GLOVE BIO SURGEON STRL SZ7 (GLOVE) ×2 IMPLANT
GLOVE BIO SURGEON STRL SZ7.5 (GLOVE) ×2 IMPLANT
GLOVE BIOGEL PI IND STRL 7.5 (GLOVE) ×2 IMPLANT
GLOVE BIOGEL PI INDICATOR 7.5 (GLOVE) ×2
GOWN STRL REUS W/ TWL LRG LVL3 (GOWN DISPOSABLE) ×5 IMPLANT
GOWN STRL REUS W/TWL LRG LVL3 (GOWN DISPOSABLE) ×5
KIT BASIN OR (CUSTOM PROCEDURE TRAY) ×2 IMPLANT
KIT TURNOVER KIT B (KITS) ×2 IMPLANT
NEEDLE HYPO 25GX1X1/2 BEV (NEEDLE) ×2 IMPLANT
NS IRRIG 1000ML POUR BTL (IV SOLUTION) ×2 IMPLANT
PACK GENERAL/GYN (CUSTOM PROCEDURE TRAY) ×2 IMPLANT
PAD ARMBOARD 7.5X6 YLW CONV (MISCELLANEOUS) ×2 IMPLANT
PENCIL SMOKE EVACUATOR (MISCELLANEOUS) ×2 IMPLANT
STRIP CLOSURE SKIN 1/2X4 (GAUZE/BANDAGES/DRESSINGS) ×2 IMPLANT
SUT ETHIBOND 0 MO6 C/R (SUTURE) IMPLANT
SUT MNCRL AB 4-0 PS2 18 (SUTURE) ×2 IMPLANT
SUT NOVA NAB DX-16 0-1 5-0 T12 (SUTURE) ×2 IMPLANT
SUT PROLENE 0 CT 1 CR/8 (SUTURE) IMPLANT
SUT VIC AB 2-0 CT1 27 (SUTURE) ×1
SUT VIC AB 2-0 CT1 TAPERPNT 27 (SUTURE) ×1 IMPLANT
SUT VIC AB 3-0 SH 27 (SUTURE) ×1
SUT VIC AB 3-0 SH 27X BRD (SUTURE) ×1 IMPLANT
SUT VICRYL AB 2 0 TIES (SUTURE) ×2 IMPLANT
SYR CONTROL 10ML LL (SYRINGE) ×2 IMPLANT
TOWEL GREEN STERILE (TOWEL DISPOSABLE) ×2 IMPLANT
TOWEL GREEN STERILE FF (TOWEL DISPOSABLE) ×2 IMPLANT

## 2019-05-12 NOTE — Anesthesia Postprocedure Evaluation (Signed)
Anesthesia Post Note  Patient: Ross Moore  Procedure(s) Performed: OPEN UMBILICAL HERNIA REPAIR (N/A Abdomen)     Patient location during evaluation: PACU Anesthesia Type: General Level of consciousness: awake and alert, patient cooperative and oriented Pain management: pain level controlled Vital Signs Assessment: post-procedure vital signs reviewed and stable Respiratory status: spontaneous breathing, nonlabored ventilation and respiratory function stable Cardiovascular status: blood pressure returned to baseline and stable Postop Assessment: no apparent nausea or vomiting Anesthetic complications: no    Last Vitals:  Vitals:   05/12/19 0920 05/12/19 0935  BP: 131/88 126/80  Pulse: 68 65  Resp: 15 17  Temp: 36.8 C 36.7 C  SpO2: 94% 97%    Last Pain:  Vitals:   05/12/19 0920  TempSrc:   PainSc: 0-No pain                 Maicee Ullman,E. Sharah Finnell

## 2019-05-12 NOTE — Anesthesia Procedure Notes (Signed)
Procedure Name: Intubation Date/Time: 05/12/2019 8:12 AM Performed by: Kyung Rudd, CRNA Pre-anesthesia Checklist: Patient identified, Emergency Drugs available, Suction available, Patient being monitored and Timeout performed Patient Re-evaluated:Patient Re-evaluated prior to induction Oxygen Delivery Method: Circle system utilized Preoxygenation: Pre-oxygenation with 100% oxygen Induction Type: IV induction Ventilation: Mask ventilation without difficulty Laryngoscope Size: Mac and 3 Grade View: Grade I Tube type: Oral Tube size: 7.5 mm Number of attempts: 1 Airway Equipment and Method: Stylet Placement Confirmation: ETT inserted through vocal cords under direct vision,  positive ETCO2 and breath sounds checked- equal and bilateral Secured at: 22 cm Tube secured with: Tape Dental Injury: Teeth and Oropharynx as per pre-operative assessment

## 2019-05-12 NOTE — Discharge Summary (Signed)
Patient ID: Ross Moore [id number] Mar 02, 1961 59 y.o.  Admit date: 05/11/2019 Discharge date: 05/12/2019  Admitting Diagnosis: Incarcerated umbilical hernia  Discharge Diagnosis Patient Active Problem List   Diagnosis Date Noted  . Incarcerated umbilical hernia 05/11/2019  . Encounter for long-term (current) use of other medications 01/26/2011  . BPH (benign prostatic hyperplasia) 10/02/2010  . OBSTRUCTIVE SLEEP APNEA 05/05/2009  . COUGH 05/05/2009  . PITUITARY INSUFFICIENCY 04/08/2008  . TESTOSTERONE DEFICIENCY 12/30/2006  . HYPERLIPIDEMIA 12/30/2006  . ANXIETY 12/30/2006  . ALLERGIC RHINITIS 12/30/2006  . HYPERGLYCEMIA 12/30/2006    Consultants none  Reason for Admission: This is a pleasant 59 yo white male with a history of OSA using CPAP and hyperlipidemia.  He has had an umbilical hernia for about a year.  It has never caused him a problem until several days ago.  In November he had bronchitis and noticed with all the coughing he did that his "lump" at his umbilicus got a little bigger, but still was not painful.  This past weekend the patient developed abdominal pain at his umbilicus.  He has not had any N/V, but has had some diarrhea.  He has otherwise had a normal oral intake.  He denies any fevers or chills.  He denies SOB, CP, or any other symptoms except periumbilical abdominal pain.  He presented to his PCP office today who referred him to the ED because of this hernia.  Her has normal labs here, but a CT scan that revealed an incarcerated fat containing umbilical hernia with some surrounding cellulitis.  We have been asked to see for further evaluation and management.  Procedures Umbilical hernia repair, Dr. Dwain Sarna 2/9  Hospital Course:  The patient was admitted and underwent an umbilical hernia repair with primary closure.  The patient tolerated the procedure well.  On POD 0, the patient was tolerating a regular diet, voiding well, mobilizing, and pain  was controlled with oral pain medications.  The patient was stable for DC home at this time with appropriate follow up made.   Physical Exam: Abd: soft, appropriately tender around incision, incision is c/d/i with staples present, +Bs, ND  Allergies as of 05/12/2019      Reactions   Lipitor [atorvastatin] Other (See Comments)   Achy feeling in muscles      Medication List    STOP taking these medications   ondansetron 8 MG disintegrating tablet Commonly known as: Zofran ODT     TAKE these medications   multivitamin tablet Take 1 tablet by mouth daily.   oxyCODONE 5 MG immediate release tablet Commonly known as: Oxy IR/ROXICODONE Take 1 tablet (5 mg total) by mouth every 4 (four) hours as needed for moderate pain.   pantoprazole 40 MG tablet Commonly known as: PROTONIX Take 40 mg by mouth 2 (two) times daily.   rosuvastatin 20 MG tablet Commonly known as: CRESTOR Take 20 mg by mouth daily.   testosterone cypionate 100 MG/ML injection Commonly known as: DEPOTESTOTERONE CYPIONATE Inject 100 mg into the muscle every 14 (fourteen) days. For IM use only   vitamin C 100 MG tablet Take 100 mg by mouth daily.        Follow-up Information    Emelia Loron, MD In 3 weeks.   Specialty: General Surgery Contact information: [address] [place] [address] [phone]           Signed: Barnetta Chapel, [facility] 05/12/2019, 12:52 PM Please see Amion for  pager number during day hours 7:00am-4:30pm, 7-11:30am on Weekends

## 2019-05-12 NOTE — Transfer of Care (Signed)
Immediate Anesthesia Transfer of Care Note  Patient: Ross Moore  Procedure(s) Performed: OPEN UMBILICAL HERNIA REPAIR (N/A Abdomen)  Patient Location: PACU  Anesthesia Type:General  Level of Consciousness: awake, alert  and oriented  Airway & Oxygen Therapy: Patient Spontanous Breathing and Patient connected to nasal cannula oxygen  Post-op Assessment: Report given to RN, Post -op Vital signs reviewed and stable and Patient moving all extremities X 4  Post vital signs: Reviewed and stable  Last Vitals:  Vitals Value Taken Time  BP 131/88 05/12/19 0923  Temp 36.8 C 05/12/19 0920  Pulse 63 05/12/19 0925  Resp 13 05/12/19 0925  SpO2 95 % 05/12/19 0925  Vitals shown include unvalidated device data.  Last Pain:  Vitals:   05/12/19 0920  TempSrc:   PainSc: 0-No pain         Complications: No apparent anesthesia complications

## 2019-05-12 NOTE — Progress Notes (Signed)
Discharged home today with wife driving patient home. Personal belongings,discharged instructions given to patient.Advised to pick up prescription called in to pharmacy of choice. Verbalized understanding of instructions

## 2019-05-12 NOTE — Progress Notes (Signed)
Day of Surgery   Subjective/Chief Complaint: unchanged   Objective: Vital signs in last 24 hours: Temp:  [97.8 F (36.6 C)-98.2 F (36.8 C)] 97.8 F (36.6 C) (02/09 0620) Pulse Rate:  [64-82] 67 (02/09 0620) Resp:  [16] 16 (02/09 0620) BP: (135-164)/(87-108) 146/99 (02/09 0620) SpO2:  [94 %-100 %] 100 % (02/09 0620) Weight:  [106.6 kg] 106.6 kg (02/08 1037)    Intake/Output from previous day: 02/08 0701 - 02/09 0700 In: 812.1 [P.O.:240; I.V.:572.1] Out: -  Intake/Output this shift: No intake/output data recorded.  abd soft incarcerated uh with overyling skin changes  Lab Results:  Recent Labs    05/11/19 1627 05/12/19 0220  WBC 9.0 8.1  HGB 17.6* 16.8  HCT 52.3* 50.1  PLT 326 322   BMET Recent Labs    05/11/19 1152 05/11/19 1152 05/11/19 1627 05/12/19 0220  NA 138  --   --  139  K 4.3  --   --  4.2  CL 102  --   --  103  CO2 26  --   --  28  GLUCOSE 112*  --   --  105*  BUN 13  --   --  13  CREATININE 1.19   < > 1.04 1.14  CALCIUM 9.6  --   --  9.2   < > = values in this interval not displayed.   PT/INR No results for input(s): LABPROT, INR in the last 72 hours. ABG No results for input(s): PHART, HCO3 in the last 72 hours.  Invalid input(s): PCO2, PO2  Studies/Results: CT ABDOMEN PELVIS W CONTRAST  Result Date: 05/11/2019 CLINICAL DATA:  Abdominal pain with nausea.  Palpable hernia EXAM: CT ABDOMEN AND PELVIS WITH CONTRAST TECHNIQUE: Multidetector CT imaging of the abdomen and pelvis was performed using the standard protocol following bolus administration of intravenous contrast. CONTRAST:  OMNIPAQUE IOHEXOL 300 MG/ML  SOLN COMPARISON:  May 14, 2012. FINDINGS: Lower chest: There is mild bibasilar atelectasis. No lung base edema or consolidation evident. Hepatobiliary: No focal liver lesions are evident. Gallbladder wall is not appreciably thickened. There is no biliary duct dilatation. Pancreas: There is no pancreatic mass or inflammatory  focus. Spleen: No splenic lesions are evident. Adrenals/Urinary Tract: Adrenals bilaterally appear normal. Kidneys bilaterally show no evident mass or hydronephrosis on either side. There is no evident renal or ureteral calculus on either side. Urinary bladder is midline with wall thickness within normal limits. Stomach/Bowel: There are sigmoid and descending colonic diverticula without diverticulitis. There is no appreciable bowel wall or mesenteric thickening. The terminal ileum appears normal. There is no evident bowel obstruction. There is no free air or portal venous air. Vascular/Lymphatic: There is no abdominal aortic aneurysm. No vascular lesions are appreciable. Major venous structures appear patent. There is no evident adenopathy in the abdomen or pelvis. Reproductive: Prostate and seminal vesicles are normal in size and contour. No pelvic mass evident. Other: There is a midline ventral hernia at the umbilical level which contains fat and soft tissue thickening consistent with a degree of panniculitis. There is no bowel extending into this hernia. At the hernia neck, the diameter from right to left dimension measures 1.4 cm. The neck of this hernia measures 1.3 cm from superior to inferior dimension. Appendix appears normal. There is no abscess or ascites in the abdomen or pelvis. Musculoskeletal: There are foci of degenerative change in the lower thoracic and lumbar regions. There are no blastic or lytic bone lesions. No intramuscular lesions are appreciable.  IMPRESSION: 1. Ventral hernia at the level of the umbilicus which contains fat and a degree of panniculitis but no bowel. 2. There are sigmoid and descending colonic diverticula without diverticulitis. No bowel obstruction. No abscess in the abdomen or pelvis. Appendix appears normal. 3. No renal or ureteral calculi. No hydronephrosis. Urinary bladder wall thickness normal. Electronically Signed   By: Lowella Grip III M.D.   On: 05/11/2019 13:30     Anti-infectives: Anti-infectives (From admission, onward)   Start     Dose/Rate Route Frequency Ordered Stop   05/12/19 0600  [MAR Hold]  ceFAZolin (ANCEF) IVPB 2g/100 mL premix     (MAR Hold since Tue 05/12/2019 at 0731.Hold Reason: Transfer to a Procedural area.)   2 g 200 mL/hr over 30 Minutes Intravenous  Once 05/11/19 1445        Assessment/Plan: Incarcerated uh -or today likely primary repair  LOS: 0 days    Rolm Bookbinder 05/12/2019

## 2019-05-12 NOTE — Op Note (Signed)
Preoperative diagnosis: Incarcerated umbilical hernia Postoperative diagnosis: Same as above Procedure: Primary repair of incarcerated umbilical hernia Surgeon: Dr. Harden Mo Assistant: Trixie Deis Anesthesia: General Estimated blood loss: Minimal Specimens: None Drains: None Sponge needle count was correct completion Disposition to recovery stable condition  Indications: This a 58 year old male with a longstanding umbilical hernia that is worsened over the past week.  On exam he has an incarcerated umbilical hernia that on CT scan shows a piece of omentum or preperitoneal fat present.  He had some cellulitis surrounding this as well.  We discussed going to the operating room for open repair.  Procedure: After informed consent was obtained the patient was given antibiotics.  SCDs were in place.  He was placed under general anesthesia without complication.  He was prepped and draped in the standard sterile surgical fashion.  A surgical timeout was then performed.  I infiltrated Marcaine below the umbilicus.  I made a curvilinear incision.  I then carried this down to the umbilical stalk which I encircled with a Kelly clamp.  I then entered into the hernia sac.  There was some murky fluid and some ischemic omentum that was present.  The defect was only about 1 cm in size.  I then excised the omentum that was incarcerated.  I tied this off and placed this back into the abdominal cavity.  I then closed the defect with numerous #1 Novafil sutures without tension.  This was hemostatic.  I then tacked the umbilicus back down with two 3-0 Vicryl sutures.  The skin was then closed with 3-0 Vicryl for Monocryl.  Glue was placed.  He tolerated this well was extubated and transferred to recovery room in stable condition.

## 2019-05-12 NOTE — Discharge Instructions (Signed)
[facility] ° °UMBILICAL OR INGUINAL HERNIA REPAIR: POST OP INSTRUCTIONS ° °Always review your discharge instruction sheet given to you by the facility where your surgery was performed. °IF YOU HAVE DISABILITY OR FAMILY LEAVE FORMS, YOU MUST BRING THEM TO THE OFFICE FOR PROCESSING.   °DO NOT GIVE THEM TO YOUR DOCTOR. ° °1. A  prescription for pain medication may be given to you upon discharge.  Take your pain medication as prescribed, if needed.  If narcotic pain medicine is not needed, then you may take acetaminophen (Tylenol), naprosyn (Alleve) or ibuprofen (Advil) as needed. °2. Take your usually prescribed medications unless otherwise directed. °3. If you need a refill on your pain medication, please contact your pharmacy.  They will contact our office to request authorization. Prescriptions will not be filled after 5 pm or on week-ends. °4. You should follow a light diet the first 24 hours after arrival home, such as soup and crackers, etc.  Be sure to include lots of fluids daily.  Resume your normal diet the day after surgery. °5. Most patients will experience some swelling and bruising around the umbilicus or in the groin and scrotum.  Ice packs and reclining will help.  Swelling and bruising can take several days to resolve.  °6. It is common to experience some constipation if taking pain medication after surgery.  Increasing fluid intake and taking a stool softener (such as Colace) will usually help or prevent this problem from occurring.  A mild laxative (Milk of Magnesia or Miralax) should be taken according to package directions if there are no bowel movements after 48 hours. °7. Unless discharge instructions indicate otherwise, you may remove your bandages 48 hours after surgery, and you may shower at that time.  You may have steri-strips (small skin tapes) in place directly over the incision.  These strips should be left on the skin for 7-10 days and will come off on their own.   If your surgeon used skin glue on the incision, you may shower in 24 hours.  The glue will flake off over the next 2-3 weeks.  Any sutures or staples will be removed at the office during your follow-up visit. °8. ACTIVITIES:  You may resume regular (light) daily activities beginning the next day--such as daily self-care, walking, climbing stairs--gradually increasing activities as tolerated.  You may have sexual intercourse when it is comfortable.  Refrain from any heavy lifting or straining until approved by your doctor. °a. You may drive when you are no longer taking prescription pain medication, you can comfortably wear a seatbelt, and you can safely maneuver your car and apply brakes. °b. RETURN TO WORK:  __________________________________________________________ °9. You should see your doctor in the office for a follow-up appointment approximately 2-3 weeks after your surgery.  Make sure that you call for this appointment within a day or two after you arrive home to insure a convenient appointment time. °10. OTHER INSTRUCTIONS:  __________________________________________________________________________________________________________________________________________________________________________________________  °WHEN TO CALL YOUR DOCTOR: °1. Fever over 101.0 °2. Inability to urinate °3. Nausea and/or vomiting °4. Extreme swelling or bruising °5. Continued bleeding from incision. °6. Increased pain, redness, or drainage from the incision ° °The clinic staff is available to answer your questions during regular business hours.  Please don’t hesitate to call and ask to speak to one of the nurses for clinical concerns.  If you have a medical emergency, go to the nearest emergency room or call 911.  A surgeon from [facility]   is always on call at the hospital ° ° °[address], [address], [place], [place]  [address] ? ° [address] [address], [place], [place]   [address] °[phone] ? [phone] ? FAX  [phone] °Web site: [email or web address] ° ° °

## 2019-05-12 NOTE — Interval H&P Note (Signed)
History and Physical Interval Note:  05/12/2019 7:55 AM  Ross Moore  has presented today for surgery, with the diagnosis of UMBILICAL HERNIA.  The various methods of treatment have been discussed with the patient and family. After consideration of risks, benefits and other options for treatment, the patient has consented to  Procedure(s): OPEN UMBILICAL HERNIA REPAIR (N/A) as a surgical intervention.  The patient's history has been reviewed, patient examined, no change in status, stable for surgery.  I have reviewed the patient's chart and labs.  Questions were answered to the patient's satisfaction.     Emelia Loron

## 2019-09-09 DIAGNOSIS — G4733 Obstructive sleep apnea (adult) (pediatric): Secondary | ICD-10-CM | POA: Diagnosis not present

## 2019-11-25 DIAGNOSIS — Z1211 Encounter for screening for malignant neoplasm of colon: Secondary | ICD-10-CM | POA: Diagnosis not present

## 2019-11-25 DIAGNOSIS — K219 Gastro-esophageal reflux disease without esophagitis: Secondary | ICD-10-CM | POA: Diagnosis not present

## 2020-01-20 DIAGNOSIS — Z1159 Encounter for screening for other viral diseases: Secondary | ICD-10-CM | POA: Diagnosis not present

## 2020-01-22 DIAGNOSIS — Z1211 Encounter for screening for malignant neoplasm of colon: Secondary | ICD-10-CM | POA: Diagnosis not present

## 2020-01-22 DIAGNOSIS — K6289 Other specified diseases of anus and rectum: Secondary | ICD-10-CM | POA: Diagnosis not present

## 2020-01-22 DIAGNOSIS — K5730 Diverticulosis of large intestine without perforation or abscess without bleeding: Secondary | ICD-10-CM | POA: Diagnosis not present

## 2020-01-22 DIAGNOSIS — D123 Benign neoplasm of transverse colon: Secondary | ICD-10-CM | POA: Diagnosis not present

## 2020-05-14 ENCOUNTER — Encounter (HOSPITAL_BASED_OUTPATIENT_CLINIC_OR_DEPARTMENT_OTHER): Payer: Self-pay | Admitting: Emergency Medicine

## 2020-05-14 ENCOUNTER — Other Ambulatory Visit: Payer: Self-pay

## 2020-05-14 ENCOUNTER — Emergency Department (HOSPITAL_BASED_OUTPATIENT_CLINIC_OR_DEPARTMENT_OTHER)
Admission: EM | Admit: 2020-05-14 | Discharge: 2020-05-14 | Disposition: A | Discharge status: Home / Self Care | Payer: BC Managed Care – PPO | Attending: Emergency Medicine | Admitting: Emergency Medicine

## 2020-05-14 ENCOUNTER — Emergency Department (HOSPITAL_BASED_OUTPATIENT_CLINIC_OR_DEPARTMENT_OTHER): Payer: BC Managed Care – PPO

## 2020-05-14 DIAGNOSIS — Z8616 Personal history of COVID-19: Secondary | ICD-10-CM | POA: Insufficient documentation

## 2020-05-14 DIAGNOSIS — R059 Cough, unspecified: Secondary | ICD-10-CM | POA: Diagnosis not present

## 2020-05-14 DIAGNOSIS — I10 Essential (primary) hypertension: Secondary | ICD-10-CM | POA: Insufficient documentation

## 2020-05-14 DIAGNOSIS — R0600 Dyspnea, unspecified: Secondary | ICD-10-CM | POA: Diagnosis not present

## 2020-05-14 DIAGNOSIS — R0982 Postnasal drip: Secondary | ICD-10-CM | POA: Diagnosis not present

## 2020-05-14 DIAGNOSIS — U071 COVID-19: Secondary | ICD-10-CM | POA: Diagnosis not present

## 2020-05-14 DIAGNOSIS — R0602 Shortness of breath: Secondary | ICD-10-CM | POA: Insufficient documentation

## 2020-05-14 DIAGNOSIS — R9431 Abnormal electrocardiogram [ECG] [EKG]: Secondary | ICD-10-CM | POA: Diagnosis not present

## 2020-05-14 DIAGNOSIS — R079 Chest pain, unspecified: Secondary | ICD-10-CM | POA: Diagnosis not present

## 2020-05-14 DIAGNOSIS — I2699 Other pulmonary embolism without acute cor pulmonale: Secondary | ICD-10-CM | POA: Diagnosis not present

## 2020-05-14 HISTORY — DX: Essential (primary) hypertension: I10

## 2020-05-14 LAB — CBC WITH DIFFERENTIAL/PLATELET
Abs Immature Granulocytes: 0.05 10*3/uL (ref 0.00–0.07)
Basophils Absolute: 0.1 10*3/uL (ref 0.0–0.1)
Basophils Relative: 1 %
Eosinophils Absolute: 0.2 10*3/uL (ref 0.0–0.5)
Eosinophils Relative: 2 %
HCT: 48.5 % (ref 39.0–52.0)
Hemoglobin: 16.6 g/dL (ref 13.0–17.0)
Immature Granulocytes: 1 %
Lymphocytes Relative: 24 %
Lymphs Abs: 2.4 10*3/uL (ref 0.7–4.0)
MCH: 29.2 pg (ref 26.0–34.0)
MCHC: 34.2 g/dL (ref 30.0–36.0)
MCV: 85.2 fL (ref 80.0–100.0)
Monocytes Absolute: 0.9 10*3/uL (ref 0.1–1.0)
Monocytes Relative: 9 %
Neutro Abs: 6.5 10*3/uL (ref 1.7–7.7)
Neutrophils Relative %: 63 %
Platelets: 349 10*3/uL (ref 150–400)
RBC: 5.69 MIL/uL (ref 4.22–5.81)
RDW: 13.6 % (ref 11.5–15.5)
WBC: 10.1 10*3/uL (ref 4.0–10.5)
nRBC: 0 % (ref 0.0–0.2)

## 2020-05-14 LAB — COMPREHENSIVE METABOLIC PANEL
ALT: 40 U/L (ref 0–44)
AST: 31 U/L (ref 15–41)
Albumin: 4.8 g/dL (ref 3.5–5.0)
Alkaline Phosphatase: 59 U/L (ref 38–126)
Anion gap: 11 (ref 5–15)
BUN: 20 mg/dL (ref 6–20)
CO2: 26 mmol/L (ref 22–32)
Calcium: 9.2 mg/dL (ref 8.9–10.3)
Chloride: 101 mmol/L (ref 98–111)
Creatinine, Ser: 1.06 mg/dL (ref 0.61–1.24)
GFR, Estimated: >60 mL/min (ref >60)
Glucose, Bld: 102 mg/dL — ABNORMAL HIGH (ref 70–99)
Potassium: 4.1 mmol/L (ref 3.5–5.1)
Sodium: 138 mmol/L (ref 135–145)
Total Bilirubin: 0.6 mg/dL (ref 0.3–1.2)
Total Protein: 7.8 g/dL (ref 6.5–8.1)

## 2020-05-14 LAB — TROPONIN I (HIGH SENSITIVITY)
Troponin I (High Sensitivity): 6 ng/L (ref <18)
Troponin I (High Sensitivity): 6 ng/L (ref <18)

## 2020-05-14 LAB — BRAIN NATRIURETIC PEPTIDE: B Natriuretic Peptide: 11.9 pg/mL (ref 0.0–100.0)

## 2020-05-14 MED ORDER — IOHEXOL 350 MG/ML SOLN
100.0000 mL | Freq: Once | INTRAVENOUS | Status: AC | PRN
  Administered 2020-05-14: 100 mL via INTRAVENOUS

## 2020-05-14 NOTE — ED Triage Notes (Signed)
Pt arrives pov, referred from UC for CT to rule out PE. Pt endorses shob, 6 weeks post Covid.

## 2020-05-14 NOTE — Discharge Instructions (Signed)
Your workup was reassuring today. Please pick up the prescriptions prescribed at Urgent Care. I would also recommend an antihistamine (benadryl, claritin, zyrtec, etc) and flonase OTC to help with your post nasal drip which may be contributing to your cough.   Follow up with your PCP regarding your ED visit today. Keep appointment as scheduled with pulmonology on 2/22.   Return to the ED for any worsening symptoms

## 2020-05-14 NOTE — ED Provider Notes (Signed)
[place] EMERGENCY DEPARTMENT Provider Note   CSN: [id number] Arrival date & time: 05/14/20  1421     History Chief Complaint  Patient presents with  . Shortness of Breath    Ross Moore is a 60 y.o. male with PMHx HTN, HLD, GERD who presents to the ED today from urgent care to rule out PE. Pt reports he had COVID 6 weeks ago. He had a mild cough at that time however his cough seemed to worsen over the past week with associated shortness of breath with coughing fits. Pt has also had occasional chest pain. He went to [facility] and had a CXR done without acute findings however was sent here with concern for PE vs ACS vs PNA vs other etiology. He was prescribed a Z pack, prednisone, 2 inhalers, and tessalon perles at urgent care - has not picked it up yet as he came straight here for PE rule out. Pt also complains of post nasal drip since COVID that has seemed to worsened - he is unsure if this is related to his cough. Denies any new medications. No other complaints at this time. Pt has an appointment with pulmonology on 2/22.   The history is provided by the patient and medical records.       Past Medical History:  Diagnosis Date  . ALLERGIC RHINITIS 12/30/2006  . ANXIETY 12/30/2006  . Atypical chest pain 03/14/2011   met test,stress myoview 01/10/2012-no ischemia  . Chronic cough onset Dec 2010  . GERD (gastroesophageal reflux disease)    Post EGD per pt Lajoyce Corners)  . Hx of viral pericarditis    negataive ECHO 01/09/2012- there had been ?hx. pericardtitis  . HYPERGLYCEMIA 12/30/2006  . HYPERLIPIDEMIA 12/30/2006  . Hypertension   . OSA on CPAP   . PITUITARY INSUFFICIENCY 04/08/2008  . TESTOSTERONE DEFICIENCY 12/30/2006    Patient Active Problem List   Diagnosis Date Noted  . Incarcerated umbilical hernia 76/28/3151  . Encounter for long-term (current) use of other medications 01/26/2011  . BPH (benign prostatic hyperplasia) 10/02/2010  . OBSTRUCTIVE SLEEP APNEA 05/05/2009   . COUGH 05/05/2009  . PITUITARY INSUFFICIENCY 04/08/2008  . TESTOSTERONE DEFICIENCY 12/30/2006  . HYPERLIPIDEMIA 12/30/2006  . ANXIETY 12/30/2006  . ALLERGIC RHINITIS 12/30/2006  . HYPERGLYCEMIA 12/30/2006    Past Surgical History:  Procedure Laterality Date  . BREAST REDUCTION SURGERY  2004  . Stress Cardiolite  01/03/1999  . UMBILICAL HERNIA REPAIR N/A 05/12/2019   Procedure: OPEN UMBILICAL HERNIA REPAIR;  Surgeon: Rolm Bookbinder, MD;  Location: [facility];  Service: General;  Laterality: N/A;  . Laddie Aquas       Family History  Problem Relation Age of Onset  . Asthma Mother   . Hypertension Mother   . Hyperlipidemia Mother     Social History   Tobacco Use  . Smoking status: Never Smoker  . Smokeless tobacco: Never Used  Substance Use Topics  . Alcohol use: Yes    Comment: 2-3 times per wk  . Drug use: No    Home Medications Prior to Admission medications   Medication Sig Start Date End Date Taking? Authorizing Provider  Ascorbic Acid (VITAMIN C) 100 MG tablet Take 100 mg by mouth daily.    [provider]  Multiple Vitamin (MULTIVITAMIN) tablet Take 1 tablet by mouth daily.    [provider]  oxyCODONE (OXY IR/ROXICODONE) 5 MG immediate release tablet Take 1 tablet (5 mg total) by mouth every 4 (four) hours as needed  for moderate pain. 05/12/19   Rolm Bookbinder, MD  pantoprazole (PROTONIX) 40 MG tablet Take 40 mg by mouth 2 (two) times daily.    [provider]  rosuvastatin (CRESTOR) 20 MG tablet Take 20 mg by mouth daily.     [provider]  testosterone cypionate (DEPOTESTOTERONE CYPIONATE) 100 MG/ML injection Inject 100 mg into the muscle every 14 (fourteen) days. For IM use only    [provider]    Allergies    Lipitor [atorvastatin]  Review of Systems   Review of Systems  Constitutional: Negative for chills and fever.  HENT:       + post nasal drip  Respiratory: Positive for cough and shortness of  breath.   Cardiovascular: Positive for chest pain. Negative for palpitations and leg swelling.  Gastrointestinal: Negative for abdominal pain, nausea and vomiting.  All other systems reviewed and are negative.   Physical Exam Updated Vital Signs BP (!) 146/99 (BP Location: Right Arm)   Pulse 65   Temp 98 F (36.7 C) (Oral)   Resp 16   Ht 5' 8"  (1.727 m)   Wt 108.9 kg   SpO2 97%   BMI 36.49 kg/m   Physical Exam Vitals and nursing note reviewed.  Constitutional:      Appearance: He is not ill-appearing.  HENT:     Head: Normocephalic and atraumatic.  Eyes:     Conjunctiva/sclera: Conjunctivae normal.  Cardiovascular:     Rate and Rhythm: Normal rate and regular rhythm.     Pulses: Normal pulses.  Pulmonary:     Effort: Pulmonary effort is normal.     Breath sounds: Normal breath sounds. No decreased breath sounds, wheezing, rhonchi or rales.     Comments: Actively coughing in the room. Able to speak in full sentences between coughing fits. LCTAB. No wheezes, rales, rhonchi. Satting 97% on RA at rest and 96-99% on RA with ambulation.  Abdominal:     Palpations: Abdomen is soft.     Tenderness: There is no abdominal tenderness.  Musculoskeletal:     Cervical back: Neck supple.     Right lower leg: No edema.     Left lower leg: No edema.  Skin:    General: Skin is warm and dry.  Neurological:     Mental Status: He is alert.     ED Results / Procedures / Treatments   Labs (all labs ordered are listed, but only abnormal results are displayed) Labs Reviewed  COMPREHENSIVE METABOLIC PANEL - Abnormal; Notable for the following components:      Result Value   Glucose, Bld 102 (*)    All other components within normal limits  CBC WITH DIFFERENTIAL/PLATELET  BRAIN NATRIURETIC PEPTIDE  TROPONIN I (HIGH SENSITIVITY)  TROPONIN I (HIGH SENSITIVITY)    EKG EKG Interpretation  Date/Time:  Saturday May 14 2020 14:44:33 EST Ventricular Rate:  72 PR  Interval:  152 QRS Duration: 100 QT Interval:  408 QTC Calculation: 446 R Axis:   -21 Text Interpretation: Normal sinus rhythm Minimal voltage criteria for LVH, may be normal variant ( R in aVL ) Borderline ECG Confirmed by Lennice Sites [phone]) on 05/14/2020 3:29:58 PM   Radiology CT Angio Chest PE W and/or Wo Contrast  Result Date: 05/14/2020 CLINICAL DATA:  Cough and dyspnea, 6 weeks post COVID EXAM: CT ANGIOGRAPHY CHEST WITH CONTRAST TECHNIQUE: Multidetector CT imaging of the chest was performed using the standard protocol during bolus administration of intravenous contrast. Multiplanar CT image reconstructions  and MIPs were obtained to evaluate the vascular anatomy. CONTRAST:  52m OMNIPAQUE IOHEXOL 350 MG/ML SOLN COMPARISON:  None. FINDINGS: Cardiovascular: The study is moderate quality for the evaluation of pulmonary embolism, mildly limited by suboptimal contrast bolus. There are no filling defects in the central, lobar, segmental or subsegmental pulmonary artery branches to suggest acute pulmonary embolism. Great vessels are normal in course and caliber. Normal heart size. No significant pericardial fluid/thickening. Mediastinum/Nodes: No discrete thyroid nodules. Unremarkable esophagus. No pathologically enlarged axillary, mediastinal or hilar lymph nodes. Lungs/Pleura: No pneumothorax. No pleural effusion. No acute consolidative airspace disease, lung masses or significant pulmonary nodules. Upper abdomen: Diffuse hepatic steatosis. Musculoskeletal: No aggressive appearing focal osseous lesions. Mild thoracic spondylosis. Review of the MIP images confirms the above findings. IMPRESSION: 1. No evidence of pulmonary embolism. No active pulmonary disease. 2. Diffuse hepatic steatosis. Electronically Signed   By: JIlona SorrelM.D.   On: 05/14/2020 16:24    Procedures Procedures   Medications Ordered in ED Medications  iohexol (OMNIPAQUE) 350 MG/ML injection 100 mL (100 mLs Intravenous  Contrast Given 05/14/20 1557)    ED Course  I have reviewed the triage vital signs and the nursing notes.  Pertinent labs & imaging results that were available during my care of the patient were reviewed by me and considered in my medical decision making (see chart for details).    MDM Rules/Calculators/A&P                          60year old male who presents to the ED today from urgent care with concern for PE given worsening cough and shortness of breath x1 week, had Covid 6 weeks ago.  Chest x-ray at urgent care negative for any abnormalities.  Arrival to the ED vitals are stable.  Patient is afebrile, nontachycardic nontachypneic and satting 97% on room air.  Blood pressure elevated on arrival at 162/131, repeat 146/99.  Lab work was obtained while patient was in the waiting room occluding a CBC, CMP, troponin, BNP as well as a CTA.  CBC without leukocytosis and hemoglobin stable at 16.6.  CMP without electrolyte abnormalities.  BNP negative at 11.9.  Initial troponin of 6 and repeat of 6.  EKG without acute ischemic changes.  CTA negative for PE.  No other abnormalities appreciated on lung findings of CTA.  On my evaluation patient is actively coughing in the room however able speak in full sentences without difficulty.  He was personally ambulated by myself and his oxygen stayed above 96%.  His lungs are clear to auscultation bilaterally, he is noted to have some postnasal drip as well which likely is contributing.  Have recommended Flonase and antihistamine over-the-counter.  Patient was prescribed multiple medications at urgent care including prednisone, Z-Pak, 2 inhalers, Tessalon Perles.  Have recommended they pick this up and take as prescribed.  Patient also has a pulmonology appointment on 2/22, advised to keep.  I do not feel patient needs additional lab work or imaging at this time.  We will plan to discharge patient home with close PCP follow-up.  Patient and his wife are in agreement  with plan is stable for discharge.   This note was prepared using Dragon voice recognition software and may include unintentional dictation errors due to the inherent limitations of voice recognition software.  Final Clinical Impression(s) / ED Diagnoses Final diagnoses:  Cough  SOB (shortness of breath)    Rx / DC Orders ED Discharge Orders  None       Discharge Instructions     Your workup was reassuring today. Please pick up the prescriptions prescribed at Urgent Care. I would also recommend an antihistamine (benadryl, claritin, zyrtec, etc) and flonase OTC to help with your post nasal drip which may be contributing to your cough.   Follow up with your PCP regarding your ED visit today. Keep appointment as scheduled with pulmonology on 2/22.   Return to the ED for any worsening symptoms       Eustaquio Maize, PA-C 05/14/20 1837    Lennice Sites, DO 05/14/20 2240

## 2020-05-24 ENCOUNTER — Encounter: Payer: Self-pay | Admitting: Internal Medicine

## 2020-05-24 ENCOUNTER — Other Ambulatory Visit: Payer: Self-pay

## 2020-05-24 ENCOUNTER — Ambulatory Visit (INDEPENDENT_AMBULATORY_CARE_PROVIDER_SITE_OTHER): Payer: BC Managed Care – PPO | Admitting: Internal Medicine

## 2020-05-24 VITALS — BP 130/80 | HR 70 | Ht 68.0 in | Wt 248.0 lb

## 2020-05-24 DIAGNOSIS — J4530 Mild persistent asthma, uncomplicated: Secondary | ICD-10-CM

## 2020-05-24 DIAGNOSIS — U071 COVID-19: Secondary | ICD-10-CM | POA: Diagnosis not present

## 2020-05-24 MED ORDER — ALBUTEROL SULFATE HFA 108 (90 BASE) MCG/ACT IN AERS: 2.0000 | INHALATION_SPRAY | Freq: Four times a day (QID) | RESPIRATORY_TRACT | 5 refills | Status: AC | PRN

## 2020-05-24 MED ORDER — BREO ELLIPTA 200-25 MCG/INH IN AEPB: 1.0000 | INHALATION_SPRAY | Freq: Every day | RESPIRATORY_TRACT | 5 refills | Status: DC

## 2020-05-24 NOTE — Patient Instructions (Addendum)
The patient should have follow up scheduled with myself in 3 months.   Prior to next visit patient should have: Spirometry/Feno  Start taking Breo once a day - gargle after use.  Get cetirizine at [facility] - 10 mg tablets, take once a day - usually at bedtime, side effects: dry mouth, makes you sleepy.   Take the albuterol rescue inhaler every 4 to 6 hours as needed for wheezing or shortness of breath. You can also take it 15 minutes before exercise or exertional activity. Side effects include heart racing or pounding, jitters or anxiety. If you have a history of an irregular heart rhythm, it can make this worse. Can also give some patients a hard time sleeping.  To inhale the aerosol using an inhaler, follow these steps:  1. Remove the protective dust cap from the end of the mouthpiece. If the dust cap was not placed on the mouthpiece, check the mouthpiece for dirt or other objects. Be sure that the canister is fully and firmly inserted in the mouthpiece. 2. If you are using the inhaler for the first time or if you have not used the inhaler in more than 14 days, you will need to prime it. You may also need to prime the inhaler if it has been dropped. Ask your pharmacist or check the manufacturer's information if this happens. To prime the inhaler, shake it well and then press down on the canister 4 times to release 4 sprays into the air, away from your face. Be careful not to get albuterol in your eyes. 3. Shake the inhaler well. 4. Breathe out as completely as possible through your mouth. 4. Hold the canister with the mouthpiece on the bottom, facing you and the canister pointing upward. Place the open end of the mouthpiece into your mouth. Close your lips tightly around the mouthpiece. 6. Breathe in slowly and deeply through the mouthpiece.At the same time, press down once on the container to spray the medication into your mouth. 7. Try to hold your breath for 10 seconds. remove the inhaler,  and breathe out slowly. 8. If you were told to use 2 puffs, wait 1 minute and then repeat steps 3-7. 9. Replace the protective cap on the inhaler. 10. Clean your inhaler regularly. Follow the manufacturer's directions carefully and ask your doctor or pharmacist if you have any questions about cleaning your inhaler.  Check the back of the inhaler to keep track of the total number of doses left on the inhaler.    By learning about asthma and how it can be controlled, you take an important step toward managing this disease. Work closely with your asthma care team to learn all you can about your asthma, how to avoid triggers, what your medications do, and how to take them correctly. With proper care, you can live free of asthma symptoms and maintain a normal, healthy lifestyle.   What is asthma? Asthma is a chronic disease that affects the airways of the lungs. During normal breathing, the bands of muscle that surround the airways are relaxed and air moves freely. During an asthma episode or "attack," there are three main changes that stop air from moving easily through the airways:  The bands of muscle that surround the airways tighten and make the airways narrow. This tightening is called bronchospasm.   The lining of the airways becomes swollen or inflamed.   The cells that line the airways produce more mucus, which is thicker than normal and clogs the  airways.  These three factors - bronchospasm, inflammation, and mucus production - cause symptoms such as difficulty breathing, wheezing, and coughing.  What are the most common symptoms of asthma? Asthma symptoms are not the same for everyone. They can even change from episode to episode in the same person. Also, you may have only one symptom of asthma, such as cough, but another person may have all the symptoms of asthma. It is important to know all the symptoms of asthma and to be aware that your asthma can present in any of these ways at any  time. The most common symptoms include: . Coughing, especially at night  . Shortness of breath  . Wheezing  . Chest tightness, pain, or pressure   Who is affected by asthma? Asthma affects 22 million Americans; about 6 million of these are children under age 58. People who have a family history of asthma have an increased risk of developing the disease. Asthma is also more common in people who have allergies or who are exposed to tobacco smoke. However, anyone can develop asthma at any time. Some people may have asthma all of their lives, while others may develop it as adults.  What causes asthma? The airways in a person with asthma are very sensitive and react to many things, or "triggers." Contact with these triggers causes asthma symptoms. One of the most important parts of asthma control is to identify your triggers and then avoid them when possible. The only trigger you do not want to avoid is exercise. Pre-treatment with medicines before exercise can allow you to stay active yet avoid asthma symptoms. Common asthma triggers include: 1. Infections (colds, viruses, flu, sinus infections)  2. Exercise  3. Weather (changes in temperature and/or humidity, cold air)  4. Tobacco smoke  5. Allergens (dust mites, pollens, pets, mold spores, cockroaches, and sometimes foods)  6. Irritants (strong odors from cleaning products, perfume, wood smoke, air pollution)  7. Strong emotions such as crying or laughing hard  8. Some medications   How is asthma diagnosed? To diagnose asthma, your doctor will first review your medical history, family history, and symptoms. Your doctor will want to know any past history of breathing problems you may have had, as well as a family history of asthma, allergies, eczema (a bumpy, itchy skin rash caused by allergies), or other lung disease. It is important that you describe your symptoms in detail (cough, wheeze, shortness of breath, chest tightness), including when  and how often they occur. The doctor will perform a physical examination and listen to your heart and lungs. He or she may also order breathing tests, allergy tests, blood tests, and chest and sinus X-rays. The tests will find out if you do have asthma and if there are any other conditions that are contributing factors.  How is asthma treated? Asthma can be controlled, but not cured. It is not normal to have frequent symptoms, trouble sleeping, or trouble completing tasks. Appropriate asthma care will prevent symptoms and visits to the emergency room and hospital. Asthma medicines are one of the mainstays of asthma treatment. The drugs used to treat asthma are explained below.  Anti-inflammatories: These are the most important drugs for most people with asthma. Anti-inflammatory drugs reduce swelling and mucus production in the airways. As a result, airways are less sensitive and less likely to react to triggers. These medications need to be taken daily and may need to be taken for several weeks before they begin to control asthma.  Anti-inflammatory medicines lead to fewer symptoms, better airflow, less sensitive airways, less airway damage, and fewer asthma attacks. If taken every day, they CONTROL or prevent asthma symptoms.   Bronchodilators: These drugs relax the muscle bands that tighten around the airways. This action opens the airways, letting more air in and out of the lungs and improving breathing. Bronchodilators also help clear mucus from the lungs. As the airways open, the mucus moves more freely and can be coughed out more easily. In short-acting forms, bronchodilators RELIEVE or stop asthma symptoms by quickly opening the airways and are very helpful during an asthma episode. In long-acting forms, bronchodilators provide CONTROL of asthma symptoms and prevent asthma episodes.  Asthma drugs can be taken in a variety of ways. Inhaling the medications by using a metered dose inhaler, dry powder  inhaler, or nebulizer is one way of taking asthma medicines. Oral medicines (pills or liquids you swallow) may also be prescribed.  Asthma severity Asthma is classified as either "intermittent" (comes and goes) or "persistent" (lasting). Persistent asthma is further described as being mild, moderate, or severe. The severity of asthma is based on how often you have symptoms both during the day and night, as well as by the results of lung function tests and by how well you can perform activities. The "severity" of asthma refers to how "intense" or "strong" your asthma is.  Asthma control Asthma control is the goal of asthma treatment. Regardless of your asthma severity, it may or may not be controlled. Asthma control means: . You are able to do everything you want to do at work and home  . You have no (or minimal) asthma symptoms  . You do not wake up from your sleep or earlier than usual in the morning due to asthma  . You rarely need to use your reliever medicine (inhaler)  Another major part of your treatment is that you are happy with your asthma care and believe your asthma is controlled.  Monitoring symptoms A key part of treatment is keeping track of how well your lungs are working. Monitoring your symptoms  what they are, how and when they happen, and how severe they are  is an important part of being able to control your asthma.  Sometimes asthma is monitored using a peak flow meter. A peak flow (PF) meter measures how fast the air comes out of your lungs. It can help you know when your asthma is getting worse, sometimes even before you have symptoms. By taking daily peak flow readings, you can learn when to adjust medications to keep asthma under good control. It is also used to create your asthma action plan (see below). Your doctor can use your peak flow readings to adjust your treatment plan in some cases.  Asthma Action Plan Based on your history and asthma severity, you and your  doctor will develop a care plan called an "asthma action plan." The asthma action plan describes when and how to use your medicines, actions to take when asthma worsens, and when to seek emergency care. Make sure you understand this plan. If you do not, ask your asthma care provider any questions you may have. Your asthma action plan is one of the keys to controlling asthma. Keep it readily available to remind you of what you need to do every day to control asthma and what you need to do when symptoms occur.  Goals of asthma therapy These are the goals of asthma treatment: . Live an  active, normal life  . Prevent chronic and troublesome symptoms  . Attend work or school every day  . Perform daily activities without difficulty  . Stop urgent visits to the doctor, emergency department, or hospital  . Use and adjust medications to control asthma with few or no side effects

## 2020-05-24 NOTE — Progress Notes (Signed)
Ross Moore    [id number]    1961-03-10  Primary Care Physician:Pharr, Thayer Jew, MD  Referring Physician: Deland Pretty, MD [address] [place],  [place] [address] Reason for Consultation: asthma Date of Consultation: 05/24/2020  Chief complaint:   Chief Complaint  Patient presents with  . Consult    Self referral for chronic cough and asthma.  Pt states he was diagnosed with Covid 04/08/20. Pt did have to go to ED 2/12 due to SOB and the cough. Pt does cough all throughout the day and has SOB mainly with exertion.     HPI: Ross Moore is a 60 y.o. gentleman with shortness of breath, wheezing, cough.  Had bronchitis in November. No childhood respiratory disease, asthma, allergies. He does get sick every year and feels it is getting worse as he gets older.   Symptoms also all worse since covid. He had been given inhalers symbicort and albuterol and they did seem to help. Also given prednisone and an antibiotic.   Never been diagnosed with asthma, but does get bronchitis every year.   Has OSA on CPAP. Has a history of deviated septum and has had surgery for this. Faithfully wears CPAP.   He has year round post nasal drainage with itchy/watery eyes. Has taken allegra in the past.   He is also having tinnitus but denies any worsening with pulsations.   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  Exposures: lives at home with wife, daughter, pet rabbit.  Smoking history: never smoker, passive smoke exposure as a kid.   Social History   Occupational History  . Occupation: Insurance account manager    Comment: [employer]    Employer: [employer]  Tobacco Use  . Smoking status: Never Smoker  . Smokeless tobacco: Never Used  Substance and Sexual Activity  . Alcohol use: Yes    Comment: 2-3 times per wk  . Drug use:  No  . Sexual activity: Not on file    Relevant family history:  Family History  Problem Relation Age of Onset  . Asthma Mother   . Hypertension Mother   . Hyperlipidemia Mother   . Allergic Disorder Daughter     Past Medical History:  Diagnosis Date  . ALLERGIC RHINITIS 12/30/2006  . ANXIETY 12/30/2006  . Atypical chest pain 03/14/2011   met test,stress myoview 01/10/2012-no ischemia  . Chronic cough onset Dec 2010  . GERD (gastroesophageal reflux disease)    Post EGD per pt Lajoyce Corners)  . Hx of viral pericarditis    negataive ECHO 01/09/2012- there had been ?hx. pericardtitis  . HYPERGLYCEMIA 12/30/2006  . HYPERLIPIDEMIA 12/30/2006  . Hypertension   . OSA on CPAP   . PITUITARY INSUFFICIENCY 04/08/2008  . TESTOSTERONE DEFICIENCY 12/30/2006    Past Surgical History:  Procedure Laterality Date  . BREAST REDUCTION SURGERY  2004  . Stress Cardiolite  01/03/1999  . UMBILICAL HERNIA REPAIR N/A 05/12/2019   Procedure: OPEN UMBILICAL HERNIA REPAIR;  Surgeon: Rolm Bookbinder, MD;  Location: [facility];  Service: General;  Laterality: N/A;  . Laddie Aquas    Physical Exam: Blood pressure 130/80, pulse 70, height 5' 8"  (1.727 m), weight 248 lb (112.5 kg), SpO2 95 %. Gen:      No acute distress ENT:   +absent soft  palate, mallampati IV Lungs:    No increased respiratory effort, symmetric chest wall excursion, clear to auscultation bilaterally, no wheezes or crackles CV:         Regular rate and rhythm; no murmurs, rubs, or gallops.  No pedal edema Abd:      + bowel sounds; soft, non-tender; obese MSK: no acute synovitis of DIP or PIP joints, no mechanics hands.  Skin:      Warm and dry; no rashes Neuro: normal speech, no focal facial asymmetry Psych: alert and oriented x3, normal mood and affect   Data Reviewed/Medical Decision Making:  Independent interpretation of tests: Imaging: . Review of patient's CT angio chest in Jan 2022 images revealed no PE, no acute cardiopulmonary  process. The patient's images have been independently reviewed by me.    PFTs: None on file  Labs:  Lab Results  Component Value Date   WBC 10.1 05/14/2020   HGB 16.6 05/14/2020   HCT 48.5 05/14/2020   MCV 85.2 05/14/2020   PLT 349 05/14/2020   Lab Results  Component Value Date   NA 138 05/14/2020   K 4.1 05/14/2020   CL 101 05/14/2020   CO2 26 05/14/2020     Immunization status:  Immunization History  Administered Date(s) Administered  . Hepatitis B 12/11/2010, 01/10/2011, 07/11/2011  . Influenza Whole 02/04/2007  . Influenza,inj,Quad PF,6+ Mos 01/11/2019, 01/01/2020  . PFIZER(Purple Top)SARS-COV-2 Vaccination 08/20/2019, 09/10/2019    . I reviewed prior external note(s) from ED visit . I reviewed the result(s) of the labs and imaging as noted above.  . I have ordered PFTs   Assessment:  Moderate persistent asthma Recent Covid 19 infection Jan 2022 Chronic Rhinitis  Plan/Recommendations:  Suspect underlying reactive airways disease with worsening due to recent covid infection Start Breo daily. Continue albnuterol prn Will start nightly cetirizine for rhinitis For tinnitus - recommend he follow up with an ENT.  Will get Spirometry/FeNO at next visit  We discussed disease management and progression at length today.     Return to Care: Return in about 3 months (around 08/21/2020).  Lenice Llamas, MD [facility]  CC: Deland Pretty, MD

## 2020-05-24 NOTE — Progress Notes (Signed)
The patient has been prescribed the inhaler albuterol, Breo. Inhaler technique was demonstrated to patient. The patient subsequently demonstrated correct technique.

## 2020-05-25 ENCOUNTER — Telehealth: Payer: Self-pay | Admitting: Internal Medicine

## 2020-05-25 NOTE — Telephone Encounter (Signed)
ND pt is wanting to know if he needs to wait a little while before he starts to exercise again.   The discharge papers say to take the inhaler 15 mins prior to any type of exertion like exercise.  He just didn't want to exercise too early if he should wait a little for the medications to kick in.  Please advise., thanks

## 2020-05-25 NOTE — Telephone Encounter (Signed)
Called and spoke with pts wife and she is aware of ND recs and nothing further is needed.

## 2020-05-25 NOTE — Telephone Encounter (Signed)
Ok and safe to exercise without taking the inhaler. I just suggest that if he is getting fatigued or winded easily with exercise, taking albuterol a few minutes beforehand might help him go further.

## 2020-08-26 ENCOUNTER — Other Ambulatory Visit (HOSPITAL_COMMUNITY): Payer: BC Managed Care – PPO

## 2020-08-30 ENCOUNTER — Ambulatory Visit: Payer: BC Managed Care – PPO | Admitting: Internal Medicine

## 2020-11-28 DIAGNOSIS — J45998 Other asthma: Secondary | ICD-10-CM | POA: Diagnosis not present

## 2020-11-28 DIAGNOSIS — U071 COVID-19: Secondary | ICD-10-CM | POA: Diagnosis not present

## 2020-11-28 DIAGNOSIS — J209 Acute bronchitis, unspecified: Secondary | ICD-10-CM | POA: Diagnosis not present

## 2021-03-21 DIAGNOSIS — Z0000 Encounter for general adult medical examination without abnormal findings: Secondary | ICD-10-CM | POA: Diagnosis not present

## 2021-03-21 DIAGNOSIS — Z125 Encounter for screening for malignant neoplasm of prostate: Secondary | ICD-10-CM | POA: Diagnosis not present

## 2021-03-31 ENCOUNTER — Other Ambulatory Visit: Payer: Self-pay | Admitting: Internal Medicine

## 2021-03-31 DIAGNOSIS — E291 Testicular hypofunction: Secondary | ICD-10-CM | POA: Diagnosis not present

## 2021-03-31 DIAGNOSIS — I10 Essential (primary) hypertension: Secondary | ICD-10-CM

## 2021-03-31 DIAGNOSIS — R7303 Prediabetes: Secondary | ICD-10-CM | POA: Diagnosis not present

## 2021-03-31 DIAGNOSIS — Z0000 Encounter for general adult medical examination without abnormal findings: Secondary | ICD-10-CM | POA: Diagnosis not present

## 2021-03-31 DIAGNOSIS — G4733 Obstructive sleep apnea (adult) (pediatric): Secondary | ICD-10-CM | POA: Diagnosis not present

## 2021-03-31 DIAGNOSIS — E875 Hyperkalemia: Secondary | ICD-10-CM | POA: Diagnosis not present

## 2021-03-31 DIAGNOSIS — K219 Gastro-esophageal reflux disease without esophagitis: Secondary | ICD-10-CM | POA: Diagnosis not present

## 2021-04-21 ENCOUNTER — Ambulatory Visit
Admission: RE | Admit: 2021-04-21 | Discharge: 2021-04-21 | Disposition: A | Discharge status: Home / Self Care | Payer: No Typology Code available for payment source | Source: Ambulatory Visit | Attending: Internal Medicine | Admitting: Internal Medicine

## 2021-04-21 DIAGNOSIS — I10 Essential (primary) hypertension: Secondary | ICD-10-CM

## 2021-04-21 DIAGNOSIS — E785 Hyperlipidemia, unspecified: Secondary | ICD-10-CM | POA: Diagnosis not present

## 2021-04-28 DIAGNOSIS — J209 Acute bronchitis, unspecified: Secondary | ICD-10-CM | POA: Diagnosis not present

## 2021-04-28 DIAGNOSIS — U071 COVID-19: Secondary | ICD-10-CM | POA: Diagnosis not present

## 2021-06-11 ENCOUNTER — Encounter (HOSPITAL_BASED_OUTPATIENT_CLINIC_OR_DEPARTMENT_OTHER): Payer: Self-pay | Admitting: *Deleted

## 2021-06-11 ENCOUNTER — Other Ambulatory Visit: Payer: Self-pay

## 2021-06-11 ENCOUNTER — Emergency Department (HOSPITAL_BASED_OUTPATIENT_CLINIC_OR_DEPARTMENT_OTHER)
Admission: EM | Admit: 2021-06-11 | Discharge: 2021-06-11 | Disposition: A | Discharge status: Home / Self Care | Payer: BC Managed Care – PPO | Attending: Emergency Medicine | Admitting: Emergency Medicine

## 2021-06-11 ENCOUNTER — Emergency Department (HOSPITAL_BASED_OUTPATIENT_CLINIC_OR_DEPARTMENT_OTHER): Payer: BC Managed Care – PPO

## 2021-06-11 DIAGNOSIS — M47816 Spondylosis without myelopathy or radiculopathy, lumbar region: Secondary | ICD-10-CM | POA: Diagnosis not present

## 2021-06-11 DIAGNOSIS — Z79899 Other long term (current) drug therapy: Secondary | ICD-10-CM | POA: Insufficient documentation

## 2021-06-11 DIAGNOSIS — I10 Essential (primary) hypertension: Secondary | ICD-10-CM | POA: Diagnosis not present

## 2021-06-11 DIAGNOSIS — N390 Urinary tract infection, site not specified: Secondary | ICD-10-CM | POA: Insufficient documentation

## 2021-06-11 DIAGNOSIS — N200 Calculus of kidney: Secondary | ICD-10-CM | POA: Diagnosis not present

## 2021-06-11 DIAGNOSIS — N132 Hydronephrosis with renal and ureteral calculous obstruction: Secondary | ICD-10-CM | POA: Insufficient documentation

## 2021-06-11 DIAGNOSIS — E860 Dehydration: Secondary | ICD-10-CM | POA: Insufficient documentation

## 2021-06-11 DIAGNOSIS — R109 Unspecified abdominal pain: Secondary | ICD-10-CM

## 2021-06-11 DIAGNOSIS — K5730 Diverticulosis of large intestine without perforation or abscess without bleeding: Secondary | ICD-10-CM | POA: Diagnosis not present

## 2021-06-11 DIAGNOSIS — R319 Hematuria, unspecified: Secondary | ICD-10-CM | POA: Diagnosis not present

## 2021-06-11 DIAGNOSIS — K760 Fatty (change of) liver, not elsewhere classified: Secondary | ICD-10-CM | POA: Diagnosis not present

## 2021-06-11 DIAGNOSIS — R1031 Right lower quadrant pain: Secondary | ICD-10-CM | POA: Diagnosis not present

## 2021-06-11 LAB — CBC WITH DIFFERENTIAL/PLATELET
Abs Immature Granulocytes: 0.05 10*3/uL (ref 0.00–0.07)
Basophils Absolute: 0.1 10*3/uL (ref 0.0–0.1)
Basophils Relative: 1 %
Eosinophils Absolute: 0.2 10*3/uL (ref 0.0–0.5)
Eosinophils Relative: 1 %
HCT: 46.6 % (ref 39.0–52.0)
Hemoglobin: 15.5 g/dL (ref 13.0–17.0)
Immature Granulocytes: 0 %
Lymphocytes Relative: 14 %
Lymphs Abs: 2.1 10*3/uL (ref 0.7–4.0)
MCH: 26.9 pg (ref 26.0–34.0)
MCHC: 33.3 g/dL (ref 30.0–36.0)
MCV: 80.9 fL (ref 80.0–100.0)
Monocytes Absolute: 1.4 10*3/uL — ABNORMAL HIGH (ref 0.1–1.0)
Monocytes Relative: 9 %
Neutro Abs: 11.5 10*3/uL — ABNORMAL HIGH (ref 1.7–7.7)
Neutrophils Relative %: 75 %
Platelets: 362 10*3/uL (ref 150–400)
RBC: 5.76 MIL/uL (ref 4.22–5.81)
RDW: 16.1 % — ABNORMAL HIGH (ref 11.5–15.5)
WBC: 15.3 10*3/uL — ABNORMAL HIGH (ref 4.0–10.5)
nRBC: 0 % (ref 0.0–0.2)

## 2021-06-11 LAB — URINALYSIS, ROUTINE W REFLEX MICROSCOPIC
Bilirubin Urine: NEGATIVE
Glucose, UA: NEGATIVE mg/dL
Ketones, ur: NEGATIVE mg/dL
Leukocytes,Ua: NEGATIVE
Nitrite: POSITIVE — AB
Protein, ur: NEGATIVE mg/dL
Specific Gravity, Urine: 1.015 (ref 1.005–1.030)
pH: 6 (ref 5.0–8.0)

## 2021-06-11 LAB — COMPREHENSIVE METABOLIC PANEL
ALT: 36 U/L (ref 0–44)
AST: 32 U/L (ref 15–41)
Albumin: 4.5 g/dL (ref 3.5–5.0)
Alkaline Phosphatase: 69 U/L (ref 38–126)
Anion gap: 9 (ref 5–15)
BUN: 16 mg/dL (ref 6–20)
CO2: 29 mmol/L (ref 22–32)
Calcium: 9.1 mg/dL (ref 8.9–10.3)
Chloride: 99 mmol/L (ref 98–111)
Creatinine, Ser: 1.5 mg/dL — ABNORMAL HIGH (ref 0.61–1.24)
GFR, Estimated: 54 mL/min — ABNORMAL LOW (ref >60)
Glucose, Bld: 114 mg/dL — ABNORMAL HIGH (ref 70–99)
Potassium: 4 mmol/L (ref 3.5–5.1)
Sodium: 137 mmol/L (ref 135–145)
Total Bilirubin: 0.9 mg/dL (ref 0.3–1.2)
Total Protein: 7.2 g/dL (ref 6.5–8.1)

## 2021-06-11 LAB — URINALYSIS, MICROSCOPIC (REFLEX)

## 2021-06-11 LAB — LIPASE, BLOOD: Lipase: 37 U/L (ref 11–51)

## 2021-06-11 MED ORDER — OXYCODONE HCL 5 MG PO TABS: 5.0000 mg | ORAL_TABLET | ORAL | 0 refills | Status: DC | PRN

## 2021-06-11 MED ORDER — SODIUM CHLORIDE 0.9 % IV SOLN
1.0000 g | Freq: Once | INTRAVENOUS | Status: AC
  Administered 2021-06-11: 1 g via INTRAVENOUS
  Filled 2021-06-11: qty 10

## 2021-06-11 MED ORDER — SODIUM CHLORIDE 0.9 % IV BOLUS
1000.0000 mL | Freq: Once | INTRAVENOUS | Status: AC
  Administered 2021-06-11: 1000 mL via INTRAVENOUS

## 2021-06-11 MED ORDER — KETOROLAC TROMETHAMINE 15 MG/ML IJ SOLN
15.0000 mg | Freq: Once | INTRAMUSCULAR | Status: AC
  Administered 2021-06-11: 15 mg via INTRAVENOUS
  Filled 2021-06-11: qty 1

## 2021-06-11 MED ORDER — HYDROCODONE-ACETAMINOPHEN 5-325 MG PO TABS
1.0000 | ORAL_TABLET | Freq: Once | ORAL | Status: AC
  Administered 2021-06-11: 1 via ORAL
  Filled 2021-06-11: qty 1

## 2021-06-11 MED ORDER — TAMSULOSIN HCL 0.4 MG PO CAPS: 0.4000 mg | ORAL_CAPSULE | Freq: Every day | ORAL | 0 refills | Status: AC

## 2021-06-11 MED ORDER — CEFPODOXIME PROXETIL 200 MG PO TABS: 200.0000 mg | ORAL_TABLET | Freq: Two times a day (BID) | ORAL | 0 refills | Status: AC

## 2021-06-11 NOTE — ED Notes (Signed)
ED Provider at bedside. 

## 2021-06-11 NOTE — ED Provider Notes (Signed)
[facility] EMERGENCY DEPARTMENT Provider Note   CSN: [id number] Arrival date & time: 06/11/21  1229     History  Chief Complaint  Patient presents with   Urinary Urgency    Ross Moore is a 61 y.o. male.  This is a 61 y.o. male  with significant medical history as below, including hld, htn, OSA on CPAP who presents to the ED with complaint of right flank pain, urine changes. Ongoing x1 week or so, started while on a cruise. Right flank pain radiating to right lower quad, a/w urgency and sens of imcomplete voiding, some nausea a/w discomfort, no emesis. No dysuria, no hematochezia or BRBPR. No hx prostate problems or kidney stone per the pt. No cp or new dib. No fevers or chills, no sick contacts. No recent med changes. Did start taking Azo last night  Symptoms improved this morning, voided well this am. Tolerating PO.    Past Medical History: 12/30/2006: ALLERGIC RHINITIS 12/30/2006: ANXIETY 03/14/2011: Atypical chest pain     Comment:  met test,stress myoview 01/10/2012-no ischemia onset Dec 2010: Chronic cough No date: GERD (gastroesophageal reflux disease)     Comment:  Post EGD per pt [place]) No date: Hx of viral pericarditis     Comment:  negataive ECHO 01/09/2012- there had been ?hx.               pericardtitis 12/30/2006: HYPERGLYCEMIA 12/30/2006: HYPERLIPIDEMIA No date: Hypertension No date: OSA on CPAP 04/08/2008: PITUITARY INSUFFICIENCY 12/30/2006: TESTOSTERONE DEFICIENCY  Past Surgical History: 2004: BREAST REDUCTION SURGERY 01/03/1999: Stress Cardiolite 05/12/2019: UMBILICAL HERNIA REPAIR; N/A     Comment:  Procedure: OPEN UMBILICAL HERNIA REPAIR;  Surgeon:               Emelia Loron, MD;  Location: [facility] OR;  Service:               General;  Laterality: N/A; 2003: [place]    The history is provided by the patient and the spouse. No language interpreter was used.      Home Medications Prior to Admission medications   Medication Sig Start  Date End Date Taking? Authorizing Provider  cefpodoxime (VANTIN) 200 MG tablet Take 1 tablet (200 mg total) by mouth 2 (two) times daily for 7 days. 06/11/21 06/18/21 Yes Tanda Rockers A, DO  oxyCODONE (ROXICODONE) 5 MG immediate release tablet Take 1 tablet (5 mg total) by mouth every 4 (four) hours as needed for severe pain. 06/11/21  Yes Tanda Rockers A, DO  tamsulosin (FLOMAX) 0.4 MG CAPS capsule Take 1 capsule (0.4 mg total) by mouth daily after breakfast for 7 days. 06/11/21 06/18/21 Yes Tanda Rockers A, DO  albuterol (VENTOLIN HFA) 108 (90 Base) MCG/ACT inhaler Inhale 2 puffs into the lungs. 05/14/20   [provider]  albuterol (VENTOLIN HFA) 108 (90 Base) MCG/ACT inhaler Inhale 2 puffs into the lungs every 6 (six) hours as needed. 05/24/20   Charlott Holler, MD  Ascorbic Acid (VITAMIN C) 100 MG tablet Take 100 mg by mouth daily.    [provider]  fluticasone furoate-vilanterol (BREO ELLIPTA) 200-25 MCG/INH AEPB Inhale 1 puff into the lungs daily. 05/24/20   Charlott Holler, MD  LORazepam (ATIVAN) 0.5 MG tablet TAKE 1/2 TABLET BY MOUTH AS NEEDED THREE TIMES DAILY FOR 30 DAYS 04/28/20   [provider]  Multiple Vitamin (MULTIVITAMIN) tablet Take 1 tablet by mouth daily.    [provider]  Multiple Vitamins-Minerals (ZINC PO) Take 1 tablet  by mouth daily.    [provider]  pantoprazole (PROTONIX) 40 MG tablet Take 40 mg by mouth 2 (two) times daily.    [provider]  rosuvastatin (CRESTOR) 20 MG tablet Take 20 mg by mouth daily.     [provider]  testosterone cypionate (DEPOTESTOTERONE CYPIONATE) 100 MG/ML injection Inject 100 mg into the muscle every 14 (fourteen) days. For IM use only    [provider]  zolpidem (AMBIEN) 10 MG tablet Take 10 mg by mouth at bedtime as needed. 04/28/20   [provider]      Allergies    Lipitor [atorvastatin]    Review of Systems   Review of Systems  Genitourinary:  Positive  for difficulty urinating, frequency and urgency.  All other systems reviewed and are negative.  Physical Exam Updated Vital Signs BP (!) 159/102   Pulse 72   Temp 97.7 F (36.5 C) (Oral)   Resp 18   Ht 5\' 8"  (1.727 m)   Wt 108.9 kg   SpO2 95%   BMI 36.49 kg/m  Physical Exam Vitals and nursing note reviewed.  Constitutional:      General: He is not in acute distress.    Appearance: He is well-developed.  HENT:     Head: Normocephalic and atraumatic.     Right Ear: External ear normal.     Left Ear: External ear normal.     Mouth/Throat:     Mouth: Mucous membranes are moist.  Eyes:     General: No scleral icterus. Cardiovascular:     Rate and Rhythm: Normal rate and regular rhythm.     Pulses: Normal pulses.     Heart sounds: Normal heart sounds.  Pulmonary:     Effort: Pulmonary effort is normal. No respiratory distress.     Breath sounds: Normal breath sounds.  Abdominal:     General: Abdomen is flat. There is no distension.     Palpations: Abdomen is soft. There is no mass.     Tenderness: There is no abdominal tenderness.  Musculoskeletal:        General: Normal range of motion.     Cervical back: Normal range of motion.     Right lower leg: No edema.     Left lower leg: No edema.  Skin:    General: Skin is warm and dry.     Capillary Refill: Capillary refill takes less than 2 seconds.  Neurological:     Mental Status: He is alert and oriented to person, place, and time.  Psychiatric:        Mood and Affect: Mood normal.        Behavior: Behavior normal.    ED Results / Procedures / Treatments   Labs (all labs ordered are listed, but only abnormal results are displayed) Labs Reviewed  CBC WITH DIFFERENTIAL/PLATELET - Abnormal; Notable for the following components:      Result Value   WBC 15.3 (*)    RDW 16.1 (*)    Neutro Abs 11.5 (*)    Monocytes Absolute 1.4 (*)    All other components within normal limits  COMPREHENSIVE METABOLIC PANEL -  Abnormal; Notable for the following components:   Glucose, Bld 114 (*)    Creatinine, Ser 1.50 (*)    GFR, Estimated 54 (*)    All other components within normal limits  URINALYSIS, ROUTINE W REFLEX MICROSCOPIC - Abnormal; Notable for the following components:   Color, Urine ORANGE (*)  Hgb urine dipstick TRACE (*)    Nitrite POSITIVE (*)    All other components within normal limits  URINALYSIS, MICROSCOPIC (REFLEX) - Abnormal; Notable for the following components:   Bacteria, UA FEW (*)    All other components within normal limits  URINE CULTURE  LIPASE, BLOOD    EKG None  Radiology CT Renal Stone Study  Result Date: 06/11/2021 CLINICAL DATA:  61 year old male with acute RIGHT abdominal, flank and pelvic pain. EXAM: CT ABDOMEN AND PELVIS WITHOUT CONTRAST TECHNIQUE: Multidetector CT imaging of the abdomen and pelvis was performed following the standard protocol without IV contrast. RADIATION DOSE REDUCTION: This exam was performed according to the departmental dose-optimization program which includes automated exposure control, adjustment of the mA and/or kV according to patient size and/or use of iterative reconstruction technique. COMPARISON:  05/11/2019 CT and prior studies FINDINGS: Please note that parenchymal and vascular abnormalities may be missed as intravenous contrast was not administered. Lower chest: No acute abnormality identified. Hepatobiliary: Hepatic steatosis noted without focal hepatic abnormality. The gallbladder is unremarkable. There is no evidence of intrahepatic or extrahepatic biliary dilatation. Pancreas: Unremarkable Spleen: Unremarkable Adrenals/Urinary Tract: A 2 mm RIGHT UVJ calculus causes mild RIGHT hydroureteronephrosis and perinephric inflammation. No other renal abnormalities are noted. The adrenal glands are unremarkable. Stomach/Bowel: Stomach is within normal limits. Appendix appears normal. No evidence of bowel wall thickening, distention, or  inflammatory changes. Colonic diverticulosis noted without evidence of diverticulitis. Vascular/Lymphatic: No significant vascular findings are present. No enlarged abdominal or pelvic lymph nodes. Reproductive: Prostate is unremarkable. Other: No ascites, focal collection or pneumoperitoneum. Musculoskeletal: No acute or suspicious bony abnormalities are noted. Mild degenerative disc disease/spondylosis in the lumbar spine again identified. IMPRESSION: 1. 2 mm RIGHT UVJ calculus causing mild RIGHT hydroureteronephrosis and perinephric inflammation. 2. Hepatic steatosis. Electronically Signed   By: Harmon Pier M.D.   On: 06/11/2021 13:07    Procedures .Critical Care Performed by: Sloan Leiter, DO Authorized by: Sloan Leiter, DO   Critical care provider statement:    Critical care time (minutes):  30   Critical care time was exclusive of:  Separately billable procedures and treating other patients   Critical care was necessary to treat or prevent imminent or life-threatening deterioration of the following conditions:  Dehydration   Critical care was time spent personally by me on the following activities:  Development of treatment plan with patient or surrogate, discussions with consultants, evaluation of patient's response to treatment, examination of patient, ordering and review of laboratory studies, ordering and review of radiographic studies, ordering and performing treatments and interventions, pulse oximetry, re-evaluation of patient's condition, review of old charts and obtaining history from patient or surrogate    Medications Ordered in ED Medications  sodium chloride 0.9 % bolus 1,000 mL ( Intravenous Stopped 06/11/21 1406)  sodium chloride 0.9 % bolus 1,000 mL (0 mLs Intravenous Stopped 06/11/21 1449)  cefTRIAXone (ROCEPHIN) 1 g in sodium chloride 0.9 % 100 mL IVPB (0 g Intravenous Stopped 06/11/21 1449)  HYDROcodone-acetaminophen (NORCO/VICODIN) 5-325 MG per tablet 1 tablet (1 tablet  Oral Given 06/11/21 1425)  ketorolac (TORADOL) 15 MG/ML injection 15 mg (15 mg Intravenous Given 06/11/21 1426)    ED Course/ Medical Decision Making/ A&P                           Medical Decision Making Amount and/or Complexity of Data Reviewed Labs: ordered. Radiology: ordered.  Risk Prescription drug management.  Initial Impression and Ddx This patient presents to the Emergency Department for the above complaint. This involves an extensive number of treatment options and is a complaint that carries with it a high risk of complications and morbidity. Vital signs were reviewed. Serious etiologies considered. Ddx includes but is not limited to: uti, nephrolithiasis, pyelo, infectious, metabolic  Patient PMH that increases complexity of ED encounter:  n/a  Previous records obtained and reviewed   Additional history obtained from spouse   Interpretation of Diagnostics Labs & imaging results that were available during my care of the patient were visualized by me and considered in my medical decision making.   I ordered imaging studies which included ct renal and I visualized the imaging and I agree with radiologist interpretation. Right sided uvj calculus with mild obstruction   Cardiac monitoring reviewed and interpreted personally which shows NSR  Social determinants of health include - N/a       Patient Reassessment and Ultimate Disposition/Management Pt feeling much better, he is ambulatory and tolerating PO. He has questionable UTI on UA, nitrite was +. Will send culture, start empiric abx. Start tx for small stone, give urine strainer. O/p f/u with uro. He has mild leukocytosis, not septic, likely 2/2 UTI vs stress response from nephrolithiasis, low susp for pyelo, not septic. Stable for o/p mgmt trial. Will f/u with uro and PCP.   The patient improved significantly and was discharged in stable condition. Detailed discussions were had with the patient regarding current  findings, and need for close f/u with PCP or on call doctor. The patient has been instructed to return immediately if the symptoms worsen in any way for re-evaluation. Patient verbalized understanding and is in agreement with current care plan. All questions answered prior to discharge.    Patient management required discussion with the following services or consulting groups:  None  Complexity of Problems Addressed Acute illness or injury that poses threat of life of bodily function  Additional Data Reviewed and Analyzed Further history obtained from: Further history from spouse/family member, Past medical history and medications listed in the EMR, and Prior labs/imaging results  Patient Encounter Risk Assessment Prescriptions and Consideration of hospitalization      This chart was dictated using voice recognition software.  Despite best efforts to proofread,  errors can occur which can change the documentation meaning.         Final Clinical Impression(s) / ED Diagnoses Final diagnoses:  Nephrolithiasis  Flank pain  Mild dehydration  Urinary tract infection with hematuria, site unspecified    Rx / DC Orders ED Discharge Orders          Ordered    tamsulosin (FLOMAX) 0.4 MG CAPS capsule  Daily after breakfast        06/11/21 1342    oxyCODONE (ROXICODONE) 5 MG immediate release tablet  Every 4 hours PRN        06/11/21 1429    cefpodoxime (VANTIN) 200 MG tablet  2 times daily        06/11/21 1434              Sloan Leiter, DO 06/12/21 1336

## 2021-06-11 NOTE — Discharge Instructions (Addendum)
It was a pleasure caring for you today in the emergency department. ? ?Please return to the emergency department for any worsening or worrisome symptoms. ? ?Please strain your urine with provided urine strainer ? ?Please take over-the-counter Motrin or Tylenol as needed for discomfort.  If pain not improved with OTC medications you may take Roxicodone.  ? ? ?

## 2021-06-11 NOTE — ED Triage Notes (Signed)
Presents to the ED today stating that he had a pain on right side, radiating to lower back, this was 2 weeks ago. Last night having some groin pain, a lot of urgency but unable to void.  ?

## 2021-06-13 LAB — URINE CULTURE: Culture: NO GROWTH

## 2021-12-03 DIAGNOSIS — S86911A Strain of unspecified muscle(s) and tendon(s) at lower leg level, right leg, initial encounter: Secondary | ICD-10-CM | POA: Diagnosis not present

## 2021-12-03 DIAGNOSIS — Z20822 Contact with and (suspected) exposure to covid-19: Secondary | ICD-10-CM | POA: Diagnosis not present

## 2021-12-03 DIAGNOSIS — J209 Acute bronchitis, unspecified: Secondary | ICD-10-CM | POA: Diagnosis not present

## 2021-12-12 DIAGNOSIS — S86111A Strain of other muscle(s) and tendon(s) of posterior muscle group at lower leg level, right leg, initial encounter: Secondary | ICD-10-CM | POA: Diagnosis not present

## 2021-12-13 ENCOUNTER — Ambulatory Visit: Payer: Self-pay

## 2021-12-13 ENCOUNTER — Ambulatory Visit: Payer: BC Managed Care – PPO | Admitting: Family Medicine

## 2021-12-13 VITALS — BP 142/82 | HR 127 | Ht 68.0 in | Wt 240.0 lb

## 2021-12-13 DIAGNOSIS — S86111A Strain of other muscle(s) and tendon(s) of posterior muscle group at lower leg level, right leg, initial encounter: Secondary | ICD-10-CM

## 2021-12-13 DIAGNOSIS — M25561 Pain in right knee: Secondary | ICD-10-CM | POA: Diagnosis not present

## 2021-12-13 DIAGNOSIS — M79661 Pain in right lower leg: Secondary | ICD-10-CM

## 2021-12-13 NOTE — Progress Notes (Signed)
   I, Philbert Riser, LAT, ATC acting as a scribe for Clementeen Graham, MD.  Subjective:    CC: R calf pain  HPI: Pt is a 61 y/o male c/o R calf pain x 2 weeks. Pt was up on a ladder taking blinds down when he over-extended his R lower leg. Pt reports the area became bruised and swollen, which has improved some over time. Last week pt felt a pull/pop in his calf, he notes there is swelling and bruising in his lower leg , has been taking tylenol for the pain and that is not helping , antalgic gait , has been icing he worked today and can feel muscle tightness when compared to his other leg.  R leg swelling: Present Aggravates: Activity Treatments tried: Copper fit compression sleeve  Pertinent review of Systems: No fevers or chills  Relevant historical information: Sleep apnea.   Objective:    Vitals:   12/13/21 1337  BP: (!) 142/82  Pulse: (!) 127  SpO2: 98%   General: Well Developed, well nourished, and in no acute distress.   MSK: Right calf swollen.  Tender palpation medial gastrocnemius.  Tender palpation medial gastrocnemius normal foot and ankle motion pain with resisted foot plantarflexion.  Lab and Radiology Results  Diagnostic Limited MSK Ultrasound of: Right calf Large hematoma/seroma right medial gastrocnemius superficial to muscle belly Impression: Gastrocnemius muscle tear with large hematoma     Impression and Recommendations:    Assessment and Plan: 61 y.o. male with right calf tear with large hematoma.  Plan for calf compression sleeve and home exercise program taught in clinic today by ATC.  Check back in 3 weeks.  This likely will result in a large seroma that ultimately may require aspiration.Marland Kitchen  PDMP not reviewed this encounter. Orders Placed This Encounter  Procedures   [place] LIMITED JOINT SPACE STRUCTURES LOW RIGHT(NO LINKED CHARGES)    Order Specific Question:   Reason for Exam (SYMPTOM  OR DIAGNOSIS REQUIRED)    Answer:   lower leg pain    Order  Specific Question:   Preferred imaging location?    Answer:   [facility]   No orders of the defined types were placed in this encounter.   Discussed warning signs or symptoms. Please see discharge instructions. Patient expresses understanding.   The above documentation has been reviewed and is accurate and complete Clementeen Graham, M.D.

## 2021-12-13 NOTE — Patient Instructions (Addendum)
Thank you for coming in today.   I recommend you obtained a compression sleeve to help with your joint problems. There are many options on the market however I recommend obtaining a Full Body Helix compression sleeve.  You can find information (including how to appropriate measure yourself for sizing) can be found at www.[email or web address].ch.  Many of these products are health savings account (HSA) eligible.   You can use the compression sleeve at any time throughout the day but is most important to use while being active as well as for 2 hours post-activity.   It is appropriate to ice following activity with the compression sleeve in place.  Calf HEP 3 week follow up

## 2022-01-09 ENCOUNTER — Ambulatory Visit: Payer: BC Managed Care – PPO | Admitting: Family Medicine

## 2022-01-30 DIAGNOSIS — F411 Generalized anxiety disorder: Secondary | ICD-10-CM | POA: Diagnosis not present

## 2022-01-30 DIAGNOSIS — H9319 Tinnitus, unspecified ear: Secondary | ICD-10-CM | POA: Diagnosis not present

## 2022-03-24 DIAGNOSIS — J209 Acute bronchitis, unspecified: Secondary | ICD-10-CM | POA: Diagnosis not present

## 2022-03-24 DIAGNOSIS — R051 Acute cough: Secondary | ICD-10-CM | POA: Diagnosis not present

## 2022-03-24 DIAGNOSIS — R062 Wheezing: Secondary | ICD-10-CM | POA: Diagnosis not present

## 2022-03-24 DIAGNOSIS — J029 Acute pharyngitis, unspecified: Secondary | ICD-10-CM | POA: Diagnosis not present

## 2022-03-28 DIAGNOSIS — J189 Pneumonia, unspecified organism: Secondary | ICD-10-CM | POA: Diagnosis not present

## 2022-05-30 DIAGNOSIS — E559 Vitamin D deficiency, unspecified: Secondary | ICD-10-CM | POA: Diagnosis not present

## 2022-05-30 DIAGNOSIS — R7309 Other abnormal glucose: Secondary | ICD-10-CM | POA: Diagnosis not present

## 2022-05-30 DIAGNOSIS — G4733 Obstructive sleep apnea (adult) (pediatric): Secondary | ICD-10-CM | POA: Diagnosis not present

## 2022-05-30 DIAGNOSIS — D649 Anemia, unspecified: Secondary | ICD-10-CM | POA: Diagnosis not present

## 2022-05-30 DIAGNOSIS — Z13220 Encounter for screening for lipoid disorders: Secondary | ICD-10-CM | POA: Diagnosis not present

## 2022-05-30 DIAGNOSIS — Z125 Encounter for screening for malignant neoplasm of prostate: Secondary | ICD-10-CM | POA: Diagnosis not present

## 2022-05-30 DIAGNOSIS — E291 Testicular hypofunction: Secondary | ICD-10-CM | POA: Diagnosis not present

## 2022-05-30 DIAGNOSIS — Z1329 Encounter for screening for other suspected endocrine disorder: Secondary | ICD-10-CM | POA: Diagnosis not present

## 2022-06-06 DIAGNOSIS — Z1339 Encounter for screening examination for other mental health and behavioral disorders: Secondary | ICD-10-CM | POA: Diagnosis not present

## 2022-06-06 DIAGNOSIS — R7309 Other abnormal glucose: Secondary | ICD-10-CM | POA: Diagnosis not present

## 2022-06-06 DIAGNOSIS — E611 Iron deficiency: Secondary | ICD-10-CM | POA: Diagnosis not present

## 2022-06-06 DIAGNOSIS — Z1331 Encounter for screening for depression: Secondary | ICD-10-CM | POA: Diagnosis not present

## 2022-06-06 DIAGNOSIS — E291 Testicular hypofunction: Secondary | ICD-10-CM | POA: Diagnosis not present

## 2022-06-06 DIAGNOSIS — G4733 Obstructive sleep apnea (adult) (pediatric): Secondary | ICD-10-CM | POA: Diagnosis not present

## 2022-06-22 DIAGNOSIS — F4310 Post-traumatic stress disorder, unspecified: Secondary | ICD-10-CM | POA: Diagnosis not present

## 2022-06-22 DIAGNOSIS — R42 Dizziness and giddiness: Secondary | ICD-10-CM | POA: Diagnosis not present

## 2022-06-22 DIAGNOSIS — I10 Essential (primary) hypertension: Secondary | ICD-10-CM | POA: Diagnosis not present

## 2022-06-22 DIAGNOSIS — F419 Anxiety disorder, unspecified: Secondary | ICD-10-CM | POA: Diagnosis not present

## 2022-07-05 DIAGNOSIS — F4310 Post-traumatic stress disorder, unspecified: Secondary | ICD-10-CM | POA: Diagnosis not present

## 2022-07-05 DIAGNOSIS — F419 Anxiety disorder, unspecified: Secondary | ICD-10-CM | POA: Diagnosis not present

## 2022-07-05 DIAGNOSIS — R42 Dizziness and giddiness: Secondary | ICD-10-CM | POA: Diagnosis not present

## 2022-07-05 DIAGNOSIS — I10 Essential (primary) hypertension: Secondary | ICD-10-CM | POA: Diagnosis not present

## 2022-07-22 IMAGING — CT CT ANGIO CHEST
2 of 8 series · 19 of 36 positions shown · IV contrast (Omnipaque)
Comparison: None.

CLINICAL DATA: Cough and dyspnea, 6 weeks post COVID

EXAM:
CT ANGIOGRAPHY CHEST WITH CONTRAST
TECHNIQUE: Multidetector CT imaging of the chest was performed using the
standard protocol during bolus administration of intravenous
contrast. Multiplanar CT image reconstructions and MIPs were
obtained to evaluate the vascular anatomy.
CONTRAST:  75mL OMNIPAQUE IOHEXOL 350 MG/ML SOLN

[Series 6: pe thins · axial · 0.84mm/px · z∈[-13,+262]mm · 18 of 409 slices shown]
[im 21/409  lung]
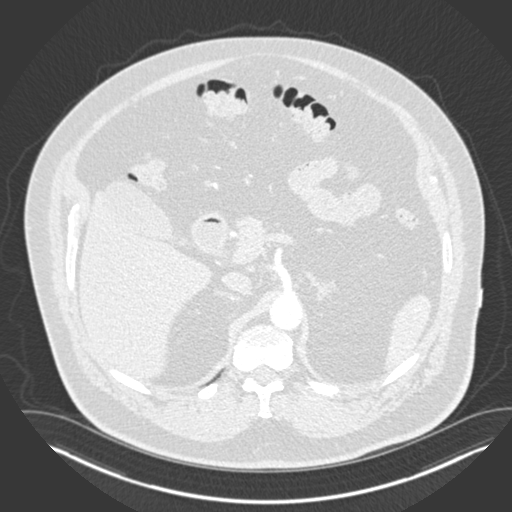
[im 41/409  mediastinal]
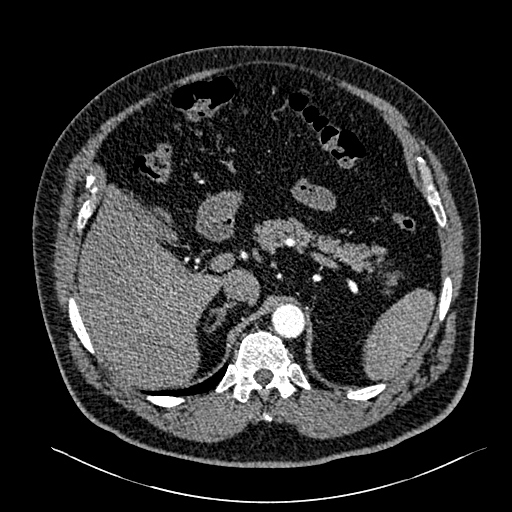
[im 62/409  lung]
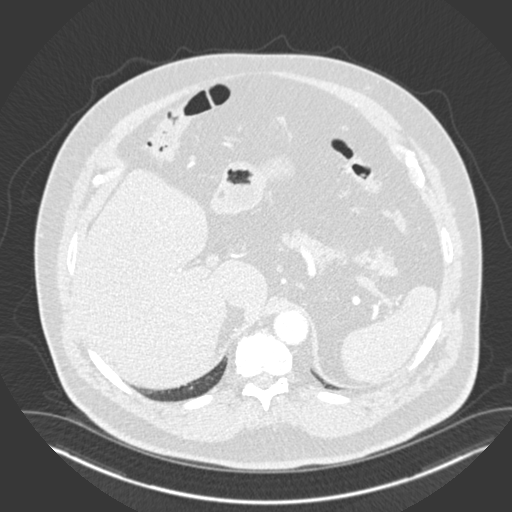
[im 82/409  mediastinal]
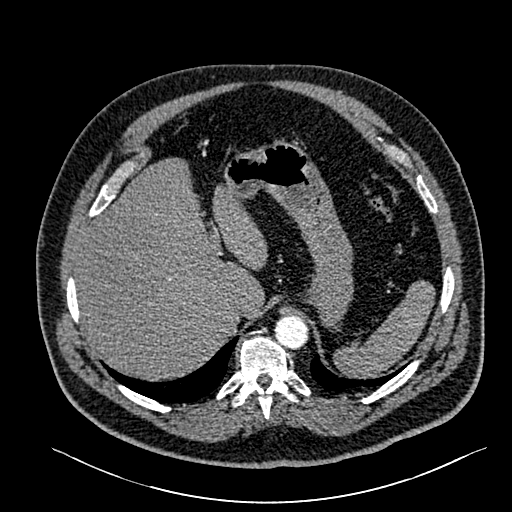
[im 103/409  lung]
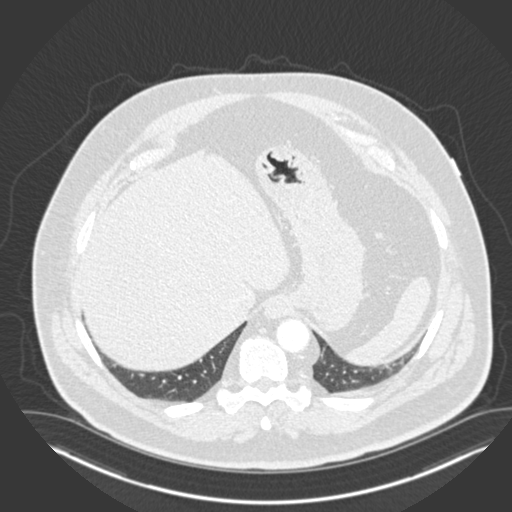
[im 123/409  mediastinal]
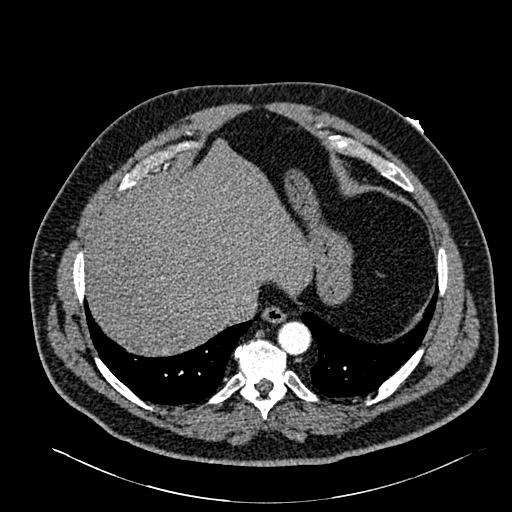
[im 143/409  lung]
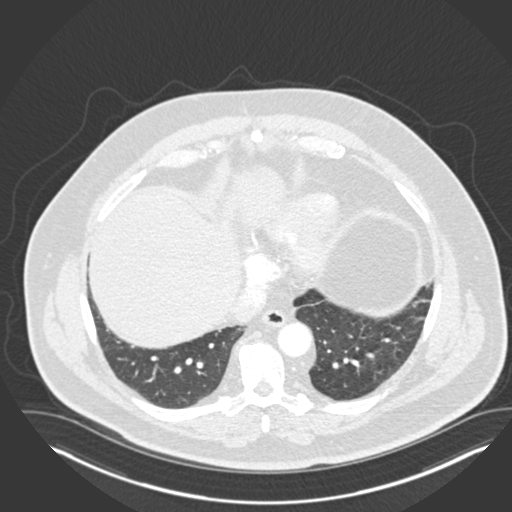
[im 164/409  mediastinal]
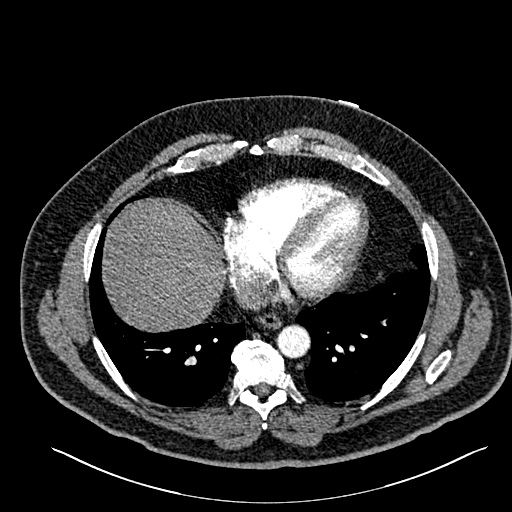
[im 184/409  lung]
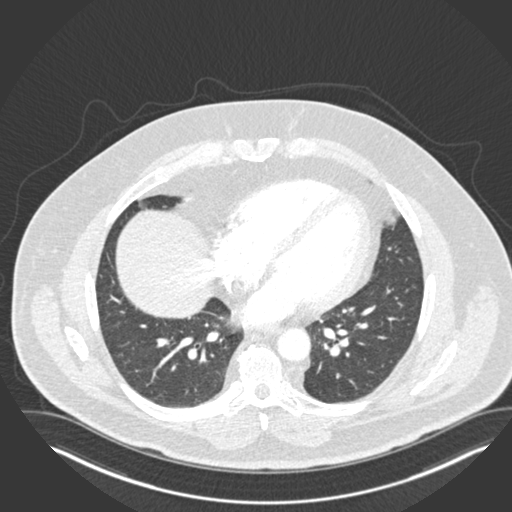
[im 225/409  mediastinal]
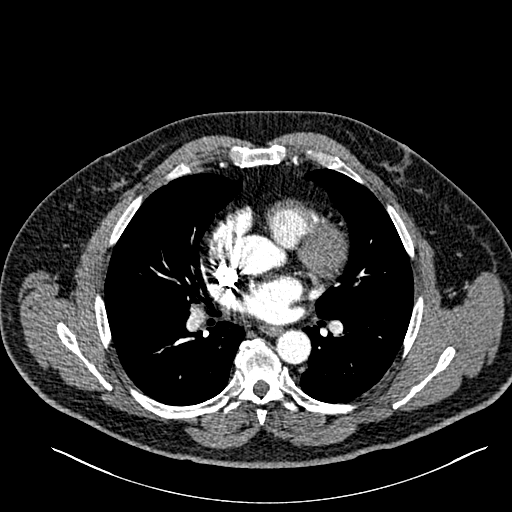
[im 245/409  lung]
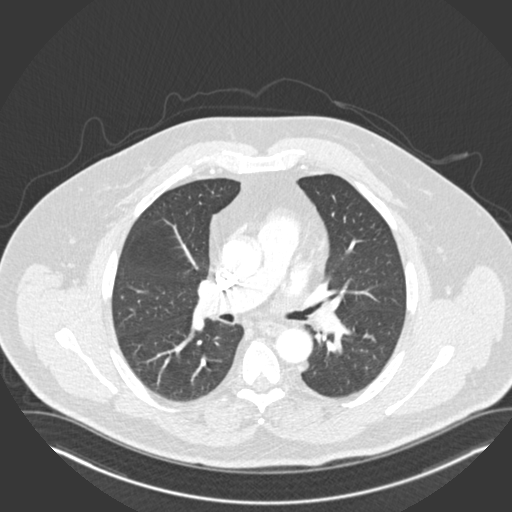
[im 266/409  mediastinal]
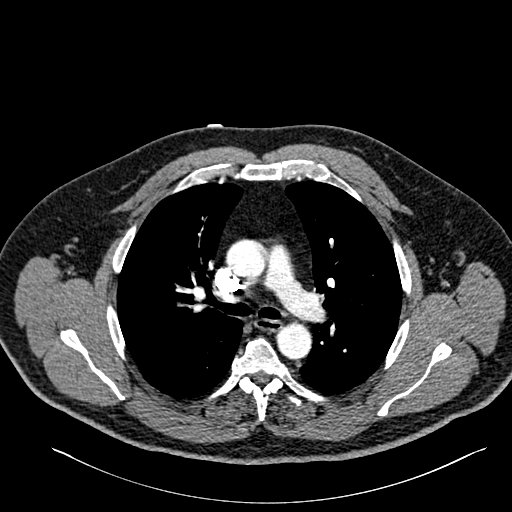
[im 286/409  lung]
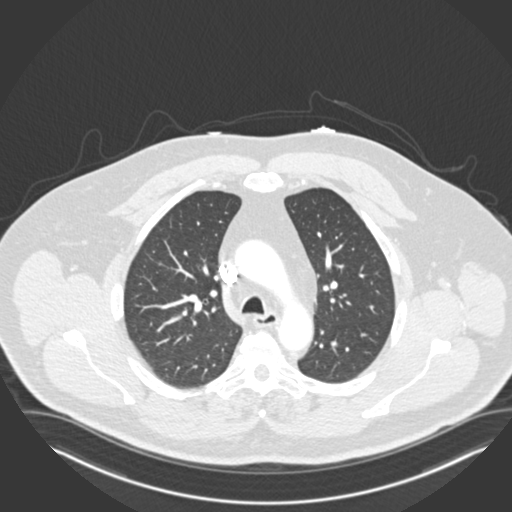
[im 307/409  mediastinal]
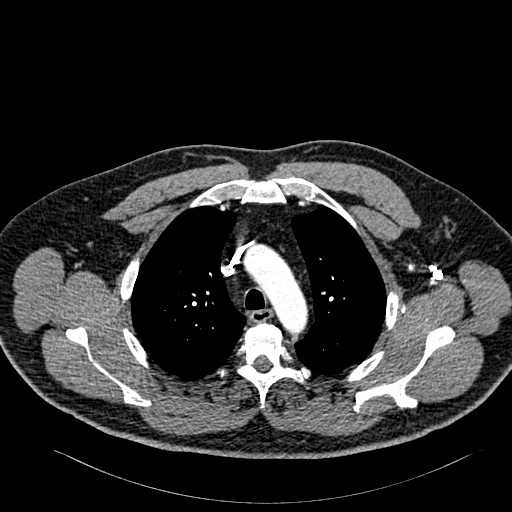
[im 327/409  lung]
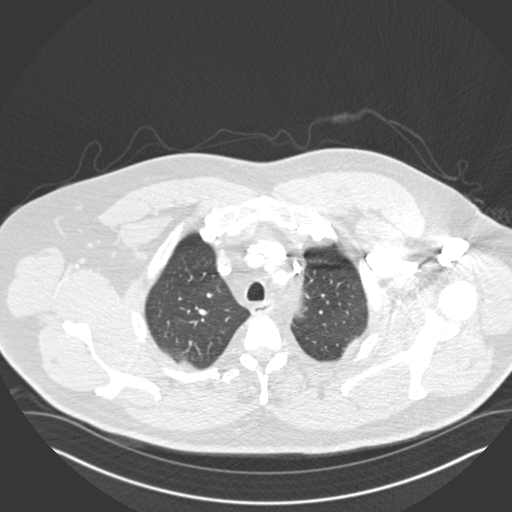
[im 347/409  mediastinal]
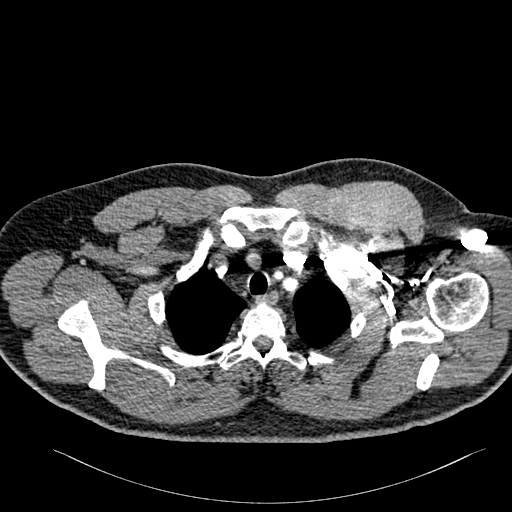
[im 368/409  lung]
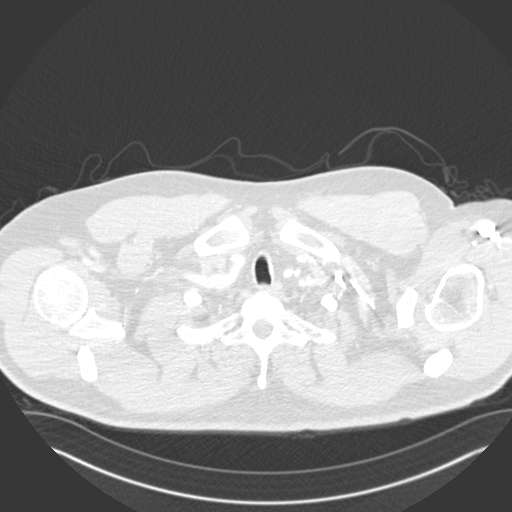
[im 388/409  mediastinal]
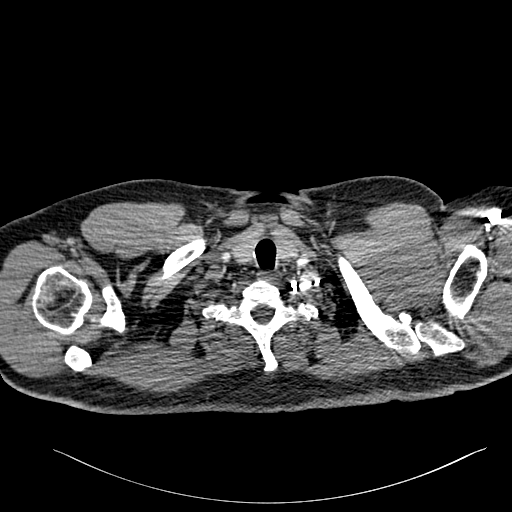

[Series 7: pe coronal mpr · coronal · 0.62mm/px · 1 of 124 slices shown]
[im 62/124  mediastinal]
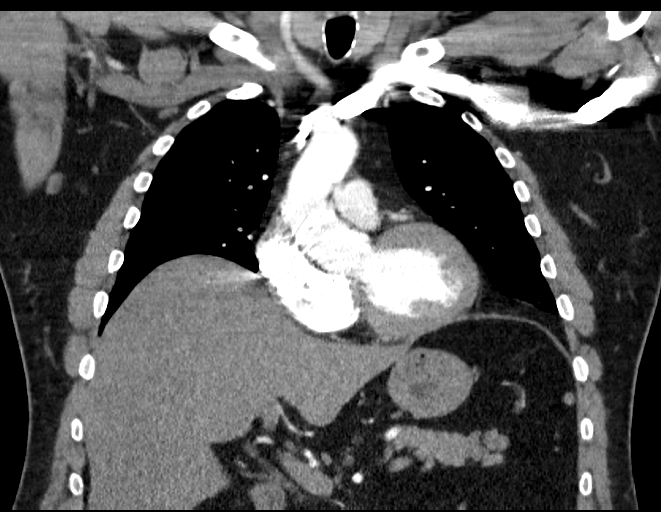

[19 of 36 positions shown; findings below may reference images not displayed]

FINDINGS: Cardiovascular: The study is moderate quality for the evaluation of
pulmonary embolism, mildly limited by suboptimal contrast bolus.
There are no filling defects in the central, lobar, segmental or
subsegmental pulmonary artery branches to suggest acute pulmonary
embolism. Great vessels are normal in course and caliber. Normal
heart size. No significant pericardial fluid/thickening.

Mediastinum/Nodes: No discrete thyroid nodules. Unremarkable
esophagus. No pathologically enlarged axillary, mediastinal or hilar
lymph nodes.

Lungs/Pleura: No pneumothorax. No pleural effusion. No acute
consolidative airspace disease, lung masses or significant pulmonary
nodules.

Upper abdomen: Diffuse hepatic steatosis.

Musculoskeletal: No aggressive appearing focal osseous lesions. Mild
thoracic spondylosis.

Review of the MIP images confirms the above findings.
IMPRESSION: 1. No evidence of pulmonary embolism. No active pulmonary disease.
2. Diffuse hepatic steatosis.

## 2022-10-02 DIAGNOSIS — H903 Sensorineural hearing loss, bilateral: Secondary | ICD-10-CM | POA: Diagnosis not present

## 2022-10-02 DIAGNOSIS — H8112 Benign paroxysmal vertigo, left ear: Secondary | ICD-10-CM | POA: Diagnosis not present

## 2022-10-02 DIAGNOSIS — H9313 Tinnitus, bilateral: Secondary | ICD-10-CM | POA: Diagnosis not present

## 2022-10-08 DIAGNOSIS — R7303 Prediabetes: Secondary | ICD-10-CM | POA: Diagnosis not present

## 2022-10-08 DIAGNOSIS — Z0000 Encounter for general adult medical examination without abnormal findings: Secondary | ICD-10-CM | POA: Diagnosis not present

## 2022-10-08 DIAGNOSIS — Z125 Encounter for screening for malignant neoplasm of prostate: Secondary | ICD-10-CM | POA: Diagnosis not present

## 2022-10-10 DIAGNOSIS — R300 Dysuria: Secondary | ICD-10-CM | POA: Diagnosis not present

## 2022-10-10 DIAGNOSIS — N529 Male erectile dysfunction, unspecified: Secondary | ICD-10-CM | POA: Diagnosis not present

## 2022-10-15 DIAGNOSIS — G4733 Obstructive sleep apnea (adult) (pediatric): Secondary | ICD-10-CM | POA: Diagnosis not present

## 2022-10-15 DIAGNOSIS — Z0000 Encounter for general adult medical examination without abnormal findings: Secondary | ICD-10-CM | POA: Diagnosis not present

## 2022-10-15 DIAGNOSIS — K219 Gastro-esophageal reflux disease without esophagitis: Secondary | ICD-10-CM | POA: Diagnosis not present

## 2022-10-15 DIAGNOSIS — E291 Testicular hypofunction: Secondary | ICD-10-CM | POA: Diagnosis not present

## 2022-10-15 DIAGNOSIS — I10 Essential (primary) hypertension: Secondary | ICD-10-CM | POA: Diagnosis not present

## 2022-11-22 ENCOUNTER — Encounter (HOSPITAL_COMMUNITY): Payer: Self-pay | Admitting: Internal Medicine

## 2022-11-23 ENCOUNTER — Encounter (HOSPITAL_COMMUNITY): Payer: Self-pay | Admitting: Internal Medicine

## 2022-11-23 DIAGNOSIS — R350 Frequency of micturition: Secondary | ICD-10-CM

## 2022-11-26 ENCOUNTER — Encounter (HOSPITAL_COMMUNITY): Payer: Self-pay | Admitting: Internal Medicine

## 2022-11-26 DIAGNOSIS — R350 Frequency of micturition: Secondary | ICD-10-CM

## 2022-11-27 ENCOUNTER — Other Ambulatory Visit (HOSPITAL_COMMUNITY): Payer: Self-pay | Admitting: Internal Medicine

## 2022-11-27 ENCOUNTER — Encounter (HOSPITAL_COMMUNITY): Payer: Self-pay | Admitting: Internal Medicine

## 2022-11-27 ENCOUNTER — Encounter: Payer: Self-pay | Admitting: Internal Medicine

## 2022-11-27 DIAGNOSIS — R350 Frequency of micturition: Secondary | ICD-10-CM

## 2022-12-19 ENCOUNTER — Ambulatory Visit (HOSPITAL_COMMUNITY)
Admission: RE | Admit: 2022-12-19 | Discharge: 2022-12-19 | Disposition: A | Discharge status: Home / Self Care | Payer: No Typology Code available for payment source | Source: Ambulatory Visit | Attending: Internal Medicine | Admitting: Internal Medicine

## 2022-12-19 DIAGNOSIS — R350 Frequency of micturition: Secondary | ICD-10-CM | POA: Diagnosis present

## 2023-01-22 ENCOUNTER — Emergency Department (HOSPITAL_BASED_OUTPATIENT_CLINIC_OR_DEPARTMENT_OTHER): Payer: No Typology Code available for payment source

## 2023-01-22 ENCOUNTER — Encounter (HOSPITAL_BASED_OUTPATIENT_CLINIC_OR_DEPARTMENT_OTHER): Payer: Self-pay | Admitting: Pediatrics

## 2023-01-22 ENCOUNTER — Emergency Department (HOSPITAL_BASED_OUTPATIENT_CLINIC_OR_DEPARTMENT_OTHER)
Admission: EM | Admit: 2023-01-22 | Discharge: 2023-01-22 | Disposition: A | Discharge status: Home / Self Care | Payer: No Typology Code available for payment source | Attending: Emergency Medicine | Admitting: Emergency Medicine

## 2023-01-22 ENCOUNTER — Other Ambulatory Visit: Payer: Self-pay

## 2023-01-22 DIAGNOSIS — R079 Chest pain, unspecified: Secondary | ICD-10-CM | POA: Diagnosis not present

## 2023-01-22 DIAGNOSIS — J45909 Unspecified asthma, uncomplicated: Secondary | ICD-10-CM | POA: Insufficient documentation

## 2023-01-22 DIAGNOSIS — I10 Essential (primary) hypertension: Secondary | ICD-10-CM | POA: Diagnosis not present

## 2023-01-22 DIAGNOSIS — R0602 Shortness of breath: Secondary | ICD-10-CM | POA: Insufficient documentation

## 2023-01-22 LAB — HEPATIC FUNCTION PANEL
ALT: 51 U/L — ABNORMAL HIGH (ref 0–44)
AST: 40 U/L (ref 15–41)
Albumin: 4.5 g/dL (ref 3.5–5.0)
Alkaline Phosphatase: 60 U/L (ref 38–126)
Bilirubin, Direct: 0.2 mg/dL (ref 0.0–0.2)
Indirect Bilirubin: 0.7 mg/dL (ref 0.3–0.9)
Total Bilirubin: 0.9 mg/dL (ref 0.3–1.2)
Total Protein: 7.4 g/dL (ref 6.5–8.1)

## 2023-01-22 LAB — TROPONIN I (HIGH SENSITIVITY)
Troponin I (High Sensitivity): 7 ng/L (ref <18)
Troponin I (High Sensitivity): 9 ng/L (ref <18)

## 2023-01-22 LAB — BASIC METABOLIC PANEL
Anion gap: 13 (ref 5–15)
BUN: 19 mg/dL (ref 6–20)
CO2: 28 mmol/L (ref 22–32)
Calcium: 9.3 mg/dL (ref 8.9–10.3)
Chloride: 96 mmol/L — ABNORMAL LOW (ref 98–111)
Creatinine, Ser: 1.02 mg/dL (ref 0.61–1.24)
GFR, Estimated: >60 mL/min (ref >60)
Glucose, Bld: 114 mg/dL — ABNORMAL HIGH (ref 70–99)
Potassium: 3.9 mmol/L (ref 3.5–5.1)
Sodium: 137 mmol/L (ref 135–145)

## 2023-01-22 LAB — CBC
HCT: 50.8 % (ref 39.0–52.0)
Hemoglobin: 17.2 g/dL — ABNORMAL HIGH (ref 13.0–17.0)
MCH: 29.5 pg (ref 26.0–34.0)
MCHC: 33.9 g/dL (ref 30.0–36.0)
MCV: 87 fL (ref 80.0–100.0)
Platelets: 343 10*3/uL (ref 150–400)
RBC: 5.84 MIL/uL — ABNORMAL HIGH (ref 4.22–5.81)
RDW: 14.4 % (ref 11.5–15.5)
WBC: 10.5 10*3/uL (ref 4.0–10.5)
nRBC: 0 % (ref 0.0–0.2)

## 2023-01-22 LAB — LIPASE, BLOOD: Lipase: 42 U/L (ref 11–51)

## 2023-01-22 LAB — D-DIMER, QUANTITATIVE: D-Dimer, Quant: <0.27 ug{FEU}/mL (ref 0.00–0.50)

## 2023-01-22 LAB — BRAIN NATRIURETIC PEPTIDE: B Natriuretic Peptide: 11.6 pg/mL (ref 0.0–100.0)

## 2023-01-22 MED ORDER — IPRATROPIUM-ALBUTEROL 0.5-2.5 (3) MG/3ML IN SOLN
3.0000 mL | Freq: Once | RESPIRATORY_TRACT | Status: AC
  Administered 2023-01-22: 3 mL via RESPIRATORY_TRACT
  Filled 2023-01-22: qty 3

## 2023-01-22 NOTE — ED Notes (Signed)
Patient ambulated with pulse ox per MD order. SAT 93% throughout, HR 92. Stated he gets more SOB with exertion and lightheaded/dizzy. Made PA aware

## 2023-01-22 NOTE — ED Provider Notes (Signed)
[facility] Provider Note   CSN: [id number] Arrival date & time: 01/22/23  1225     History  Chief Complaint  Patient presents with   Chest Pain    Ross Moore is a 62 y.o. male history of pituitary insufficiency, hyperglycemia, hyperlipidemia, anxiety, GERD, hypertension, OSA on CPAP presented with progressive shortness of breath and chest pain over the past few months.  Patient states he has become progressively more dyspnea on exertion but denies any leg limb swelling or productive cough.  Patient denies any calf tenderness or recent travel hospitalization surgery, previous history of cancer, hemoptysis, previous blood clots or PE.  Patient states he does a history of asthma however has not been able to use his asthma inhaler as he has been out of medication during this time as well and feels that he has been wheezing.  Patient woke up this morning with chest pain when he takes of breath along with some nausea due to the shortness of breath.  Patient denies smoking history, COPD.  Patient denies abdominal pain, emesis, constipation, diarrhea, dysuria, fevers, change in sensation/motor skills  Home Medications Prior to Admission medications   Medication Sig Start Date End Date Taking? Authorizing Provider  albuterol (VENTOLIN HFA) 108 (90 Base) MCG/ACT inhaler Inhale 2 puffs into the lungs. 05/14/20   [provider]  albuterol (VENTOLIN HFA) 108 (90 Base) MCG/ACT inhaler Inhale 2 puffs into the lungs every 6 (six) hours as needed. 05/24/20   Charlott Holler, MD  Ascorbic Acid (VITAMIN C) 100 MG tablet Take 100 mg by mouth daily.    [provider]  fluticasone furoate-vilanterol (BREO ELLIPTA) 200-25 MCG/INH AEPB Inhale 1 puff into the lungs daily. 05/24/20   Charlott Holler, MD  LORazepam (ATIVAN) 0.5 MG tablet TAKE 1/2 TABLET BY MOUTH AS NEEDED THREE TIMES DAILY FOR 30 DAYS 04/28/20   [provider]  Multiple  Vitamin (MULTIVITAMIN) tablet Take 1 tablet by mouth daily.    [provider]  Multiple Vitamins-Minerals (ZINC PO) Take 1 tablet by mouth daily.    [provider]  oxyCODONE (ROXICODONE) 5 MG immediate release tablet Take 1 tablet (5 mg total) by mouth every 4 (four) hours as needed for severe pain. 06/11/21   Sloan Leiter, DO  pantoprazole (PROTONIX) 40 MG tablet Take 40 mg by mouth 2 (two) times daily.    [provider]  rosuvastatin (CRESTOR) 20 MG tablet Take 20 mg by mouth daily.     [provider]  testosterone cypionate (DEPOTESTOTERONE CYPIONATE) 100 MG/ML injection Inject 100 mg into the muscle every 14 (fourteen) days. For IM use only    [provider]  zolpidem (AMBIEN) 10 MG tablet Take 10 mg by mouth at bedtime as needed. 04/28/20   [provider]      Allergies    Lipitor [atorvastatin]    Review of Systems   Review of Systems  Cardiovascular:  Positive for chest pain.    Physical Exam Updated Vital Signs BP 125/81   Pulse 63   Temp 97.9 F (36.6 C)   Resp 18   Ht 5\' 8"  (1.727 m)   Wt 112.5 kg   SpO2 97%   BMI 37.71 kg/m  Physical Exam Vitals reviewed.  Constitutional:      General: He is not in acute distress. HENT:     Head: Normocephalic and atraumatic.  Eyes:     Extraocular Movements: Extraocular movements  intact.     Conjunctiva/sclera: Conjunctivae normal.     Pupils: Pupils are equal, round, and reactive to light.  Cardiovascular:     Rate and Rhythm: Normal rate and regular rhythm.     Pulses: Normal pulses.     Heart sounds: Normal heart sounds.     Comments: 2+ bilateral radial/dorsalis pedis pulses with regular rate Pulmonary:     Effort: Pulmonary effort is normal. No respiratory distress.     Breath sounds: Normal breath sounds.  Abdominal:     Palpations: Abdomen is soft.     Tenderness: There is no abdominal tenderness. There is no guarding or rebound.  Musculoskeletal:         General: Normal range of motion.     Cervical back: Normal range of motion and neck supple.     Comments: 5 out of 5 bilateral grip/leg extension strength No pitting edema or leg swelling or calf tenderness noted  Skin:    General: Skin is warm and dry.     Capillary Refill: Capillary refill takes less than 2 seconds.  Neurological:     General: No focal deficit present.     Mental Status: He is alert and oriented to person, place, and time.     Comments: Sensation intact in all 4 limbs  Psychiatric:        Mood and Affect: Mood normal.     ED Results / Procedures / Treatments   Labs (all labs ordered are listed, but only abnormal results are displayed) Labs Reviewed  BASIC METABOLIC PANEL - Abnormal; Notable for the following components:      Result Value   Chloride 96 (*)    Glucose, Bld 114 (*)    All other components within normal limits  CBC - Abnormal; Notable for the following components:   RBC 5.84 (*)    Hemoglobin 17.2 (*)    All other components within normal limits  HEPATIC FUNCTION PANEL - Abnormal; Notable for the following components:   ALT 51 (*)    All other components within normal limits  BRAIN NATRIURETIC PEPTIDE  LIPASE, BLOOD  D-DIMER, QUANTITATIVE  TROPONIN I (HIGH SENSITIVITY)  TROPONIN I (HIGH SENSITIVITY)    EKG EKG Interpretation Date/Time:  Tuesday January 22 2023 12:31:50 EDT Ventricular Rate:  72 PR Interval:  151 QRS Duration:  101 QT Interval:  412 QTC Calculation: 451 R Axis:   -32  Text Interpretation: Sinus rhythm Left axis deviation Borderline T wave abnormalities No significant change since last tracing Confirmed by Melene Plan [phone]) on 01/22/2023 12:45:28 PM  Radiology DG Chest 2 View  Result Date: 01/22/2023 CLINICAL DATA:  Chest pain and shortness of breath EXAM: CHEST - 2 VIEW COMPARISON:  Chest x-ray 05/05/2009 FINDINGS: The heart size and mediastinal contours are within normal limits. Both lungs are clear. The  visualized skeletal structures are unremarkable. IMPRESSION: No active cardiopulmonary disease. Electronically Signed   By: Darliss Cheney M.D.   On: 01/22/2023 15:34    Procedures Procedures    Medications Ordered in ED Medications  ipratropium-albuterol (DUONEB) 0.5-2.5 (3) MG/3ML nebulizer solution 3 mL (3 mLs Nebulization Given 01/22/23 1258)    ED Course/ Medical Decision Making/ A&P                                 Medical Decision Making Amount and/or Complexity of Data Reviewed Labs: ordered. Radiology: ordered.  Risk Prescription drug  management.   Ova Freshwater 62 y.o. presented today for shortness of breath.  Working DDx that I considered at this time includes, but not limited to, asthma/COPD exacerbation, URI, viral illness, anemia, ACS, PE, pneumonia, pleural effusion, lung cancer.  R/o DDx: asthma/COPD exacerbation, URI, viral illness, anemia, ACS, PE, pneumonia, pleural effusion, lung cancer: These are considered less likely due to history of present illness, physical exam, labs/imaging findings  Review of prior external notes: 01/21/2023 unknown  Unique Tests and My Interpretation:  CBC: Unremarkable BMP: Unremarkable Lipase: Unremarkable Hepatic function panel: Unremarkable EKG: Sinus 72 bpm, no ST elevations or depressions noted, no blocks noted Troponin: 7, 9 CXR: No acute changes D-dimer: Negative  Social Determinants of Health: none  Discussion with Independent Historian:  wife  Discussion of Management of Tests: None  Risk: Medium: prescription drug management  Risk Stratification Score:  Well's Score: 0  Plan: On exam patient was in no acute distress with stable vitals.  Patient's exam does show that patient appears short of breath however the rest of exam was reassuring as he does not appear fluid overloaded or showing any red flag symptoms.  Patient states he is becoming more dyspnea on exertion lately but denies any history of heart  failure however will obtain BNP.  Patient is well score of 0 and given the chronicity of symptoms being present for the past few months highly doubt PE and will not order D-dimer at this time.  Patient does state he has history of asthma however has been able to use his inhaler at home as he is out and so we will give breathing treatment in the meantime.  On recheck patient is still felt short of breath and does not believe the breathing treatment helped.  Patient's labs are reassuring at this time however still waiting on chest x-ray to be read.  I reviewed patient's meds in the room with the patient and patient is not on any new medications that would cause him to be short of breath as a side effect.  I spoke to the patient we agreed to ambulate to see where his oxygen levels are high when he ambulates and to add on D-dimer to rule out PE although this is been going on for 3 months due to patient still being short of breath.  If negative patient agreed to discharge [facility] for his persistent shortness of breath.  Patient was ambulated and did not become hypoxic with no increase in heart rate either.  Patient's hemoglobin is slightly elevated however patient is on testosterone supplements through the [place] which would explain this..  Currently awaiting D-dimer.  D-dimer negative.  Chest x-ray negative as well.  Patient is been ER for 3 hours and 15 minutes and has not once required oxygen his vitals remained stable throughout.  Will recommend patient to follow-up with the pulmonologist at the [facility] patient shortness of breath as patient has had reassuring workup here and physical exam.  Patient states he does have history of asthma and uses albuterol nebulizer at home for this and does endorse wheezing and so suspect this may be playing a role in patient's symptoms however patient is not currently having wheezing or asthma exacerbation like  symptoms and symptoms been going on for 3 months that would be inconsistent with the timeline.  Patient was given return precautions. Patient stable for discharge at this time.  Patient verbalized understanding of  plan.  This chart was dictated using voice recognition software.  Despite best efforts to proofread,  errors can occur which can change the documentation meaning.         Final Clinical Impression(s) / ED Diagnoses Final diagnoses:  Shortness of breath    Rx / DC Orders ED Discharge Orders     None         Remi Deter 01/22/23 1549    Melene Plan, DO 01/23/23 [phone]

## 2023-01-22 NOTE — ED Notes (Signed)
Report rec'd from prev RN 

## 2023-01-22 NOTE — Discharge Instructions (Addendum)
[facility] and see if you can see the pulmonologist through the [facility] in regards symptoms and ER visit.  Today your labs are reassuring along with a chest x-ray and physical exam.  Please continue to use your albuterol inhaler or nebulizer at home.  If symptoms change or worsen please return to ER.

## 2023-01-22 NOTE — ED Triage Notes (Signed)
C/O chest pain started earlier today along with nausea, SOB worst with exertion. Stated he had some associated wheezing with it, but have not used his inhaler today.

## 2023-04-22 ENCOUNTER — Ambulatory Visit: Payer: No Typology Code available for payment source | Admitting: Internal Medicine

## 2023-04-22 ENCOUNTER — Encounter: Payer: Self-pay | Admitting: Internal Medicine

## 2023-04-22 VITALS — BP 120/88 | HR 61 | Temp 97.8°F | Ht 68.0 in | Wt 225.2 lb

## 2023-04-22 DIAGNOSIS — G4733 Obstructive sleep apnea (adult) (pediatric): Secondary | ICD-10-CM

## 2023-04-22 DIAGNOSIS — K219 Gastro-esophageal reflux disease without esophagitis: Secondary | ICD-10-CM | POA: Diagnosis not present

## 2023-04-22 DIAGNOSIS — R0602 Shortness of breath: Secondary | ICD-10-CM | POA: Diagnosis not present

## 2023-04-22 LAB — POCT EXHALED NITRIC OXIDE: FeNO level (ppb): 9

## 2023-04-22 NOTE — Patient Instructions (Addendum)
It was a pleasure to see you today!  Please schedule follow up scheduled with myself in 4 months.  If my schedule is not open yet, we will contact you with a reminder closer to that time. Please call [phone] if you haven't heard from [place] a month before, and always call us sooner if issues or concerns arise. You can also send [place] a message through MyChart, but but aware that this is not to be used for urgent issues and it may take up to 5-7 days to receive a reply. Please be aware that you will likely be able to view your results before I have a chance to respond to them. Please give [place] 5 business days to respond to any non-urgent results.   Start taking advair 1 puff twice daily, gargle after use.   Take the albuterol rescue inhaler every 4 to 6 hours as needed for wheezing or shortness of breath. You can also take it 15 minutes before exercise or exertional activity. Side effects include heart racing or pounding, jitters or anxiety. If you have a history of an irregular heart rhythm, it can make this worse. Can also give some patients a hard time sleeping.  Keep wearing you Bpap for sleep apnea.  Keep taking the acid reflux medication.   Congratulations on making healthy changes in your life! Losing weight will continue to help your reflux and your breathing.   If you are able to get a copy of your breathing testing records from the [place], that will be helpful for next visit.

## 2023-04-22 NOTE — Progress Notes (Signed)
Ross Moore    [id number]    01-31-1961  Primary Care Physician:[facility] Date of Appointment: 04/22/2023 Established Patient Visit  Chief complaint:   Chief Complaint  Patient presents with   Consult    Sob and wheezing.     HPI: Ross Moore is a 63 y.o. man with mild persistent asthma, allergic rhinitis, and osa on bpap. Retired from [employer].   Interval Updates: Here for follow up after 35 months. At that time was having shortness of breath, chest tightness following covid infection. Was prescribed Breo and albuterol prn as well as cetirizine, but never followed up. Didn't feel like inhalers helped.  Had pfts done at the [place] in October of 2024. Was told it was normal.   Having trouble with his sleep apnea - he is now on a bipap. AHI>72 an hour. Feels tired all the time. Coughs with exertion. [facility]. Does have reflux and does not feel is well controlled. Is on BID PPI which helps.   [facility] - advair took it for a couple of weeks. He does think the albuterol does help his breathing.   He has lost about 25 lbs and does think that has improved his breathing. Has joined local weight loss clinic.  No episodes of bronchitis in the last year.    I have reviewed the patient's family social and past medical history and updated as appropriate.   Past Medical History:  Diagnosis Date   ALLERGIC RHINITIS 12/30/2006   ANXIETY 12/30/2006   Atypical chest pain 03/14/2011   met test,stress myoview 01/10/2012-no ischemia   Chronic cough onset Dec 2010   GERD (gastroesophageal reflux disease)    Post EGD per pt [facility])   Hx of viral pericarditis    negataive ECHO 01/09/2012- there had been ?hx. pericardtitis   HYPERGLYCEMIA 12/30/2006   HYPERLIPIDEMIA 12/30/2006   Hypertension    OSA on CPAP    PITUITARY INSUFFICIENCY 04/08/2008   TESTOSTERONE DEFICIENCY 12/30/2006    Past Surgical History:  Procedure  Laterality Date   BREAST REDUCTION SURGERY  2004   Stress Cardiolite  01/03/1999   UMBILICAL HERNIA REPAIR N/A 05/12/2019   Procedure: OPEN UMBILICAL HERNIA REPAIR;  Surgeon: Emelia Loron, MD;  Location: [facility] OR;  Service: General;  Laterality: N/A;   [place]  2003    Family History  Problem Relation Age of Onset   Asthma Mother    Hypertension Mother    Hyperlipidemia Mother    Allergic Disorder Daughter     Social History   Occupational History   Occupation: Pensions consultant    Comment: Production designer, 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    Employer: [employer]  Tobacco Use   Smoking status: Never   Smokeless tobacco: Never  Substance and Sexual Activity   Alcohol use: Yes    Comment: 2-3 times per wk   Drug use: No   Sexual activity: Not on file     Physical Exam: Blood pressure 120/88, pulse 61, temperature 97.8 F (36.6 C), temperature source Oral, height 5\' 8"  (1.727 m), weight 225 lb 3.2 oz (102.2 kg), SpO2 97%.  Gen:      No acute distress ENT:  no thrush, no nasal polyps, mucus membranes moist Lungs:    No increased respiratory effort, symmetric chest wall excursion, clear to auscultation bilaterally, no wheezes or crackles CV:         Regular rate  and rhythm; no murmurs, rubs, or gallops.  No pedal edema   Data Reviewed: Imaging: I have personally reviewed the chest xray October 2024 - no acute cardiopulmonary process  PFTs:      No data to display         I have personally reviewed the patient's PFTs and   Labs: Lab Results  Component Value Date   NA 137 01/22/2023   K 3.9 01/22/2023   CO2 28 01/22/2023   GLUCOSE 114 (H) 01/22/2023   BUN 19 01/22/2023   CREATININE 1.02 01/22/2023   CALCIUM 9.3 01/22/2023   GFR 75.96 01/26/2011   GFRNONAA >60 01/22/2023   Lab Results  Component Value Date   WBC 10.5 01/22/2023   HGB 17.2 (H) 01/22/2023   HCT 50.8 01/22/2023   MCV 87.0 01/22/2023   PLT 343 01/22/2023    Immunization status: Immunization History   Administered Date(s) Administered   Hepatitis B 12/11/2010, 01/10/2011, 07/11/2011   Influenza Whole 02/04/2007   Influenza,inj,Quad PF,6+ Mos 01/11/2019, 01/01/2020   PFIZER(Purple Top)SARS-COV-2 Vaccination 08/20/2019, 09/10/2019    External Records Personally Reviewed:   Assessment:  Possible asthma Severe OSA on BPAP GERD not well controlled.   Plan/Recommendations:  Feno today 9 ppb  Start taking advair 1 puff twice daily, gargle after use.   Take the albuterol rescue inhaler every 4 to 6 hours as needed for wheezing or shortness of breath. You can also take it 15 minutes before exercise or exertional activity. Side effects include heart racing or pounding, jitters or anxiety. If you have a history of an irregular heart rhythm, it can make this worse. Can also give some patients a hard time sleeping.  Keep wearing you Bpap for sleep apnea.  Keep taking the acid reflux medication.   Congratulations on making healthy changes in your life! Losing weight will continue to help your reflux and your breathing.   If you are able to get a copy of your breathing testing records from the [place], that will be helpful for next visit.    Return to Care: Return in about 4 months (around 08/20/2023).   Durel Salts, MD Pulmonary and Critical Care Medicine [facility] Office:[phone]

## 2023-08-07 ENCOUNTER — Ambulatory Visit: Payer: No Typology Code available for payment source | Admitting: Internal Medicine

## 2023-08-19 IMAGING — CT CT RENAL STONE PROTOCOL
2 of 4 series · 16 of 46 positions shown, 18 images · non-contrast
Comparison: 05/11/2019 CT and prior studies

CLINICAL DATA: 60-year-old male with acute RIGHT abdominal, flank
and pelvic pain.



[Series 2: axial st · axial · 0.98mm/px · z∈[+839,+1284]mm · 13 of 99 slices shown, 15 images]
[im 5/99  soft-tissue]
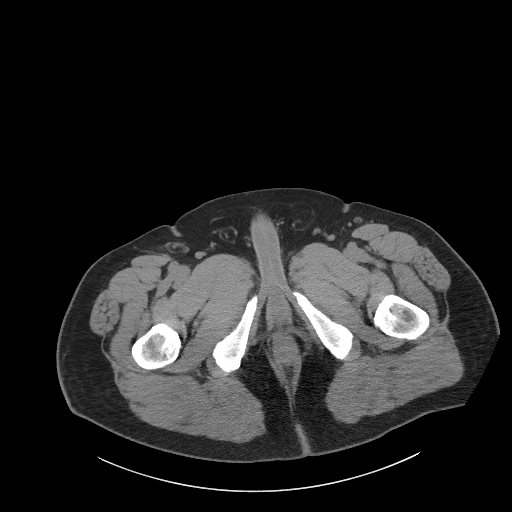
[im 5/99  bone]
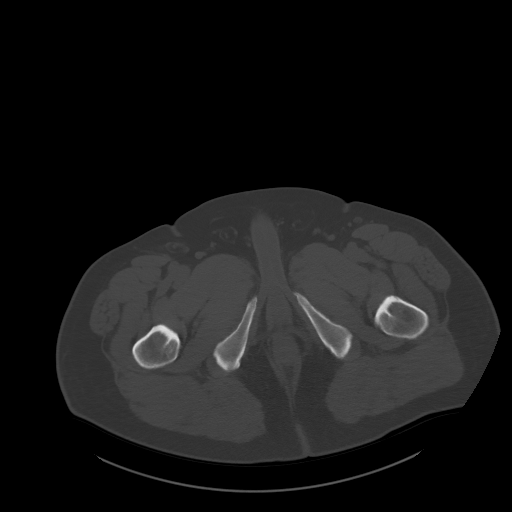
[im 13/99  soft-tissue]
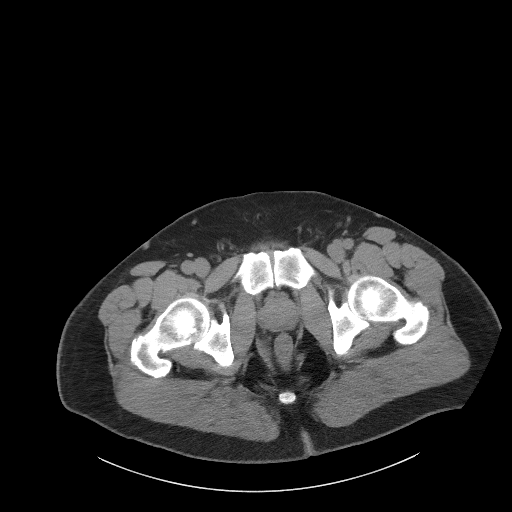
[im 21/99  soft-tissue]
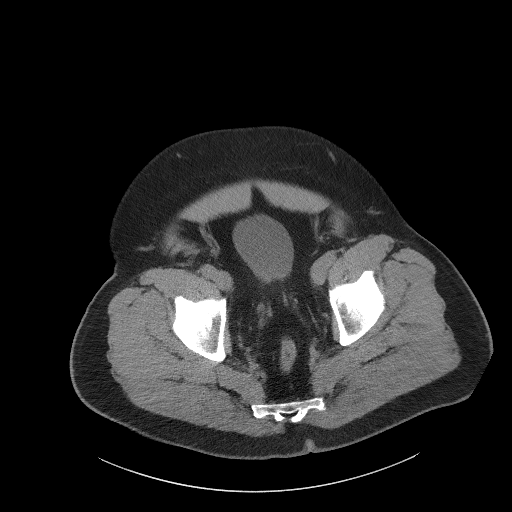
[im 29/99  soft-tissue]
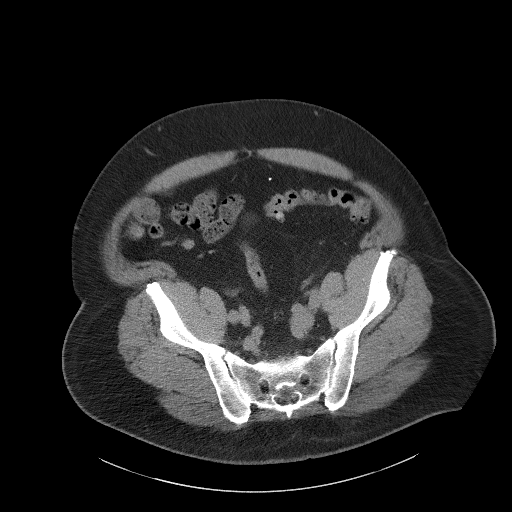
[im 33/99  soft-tissue]
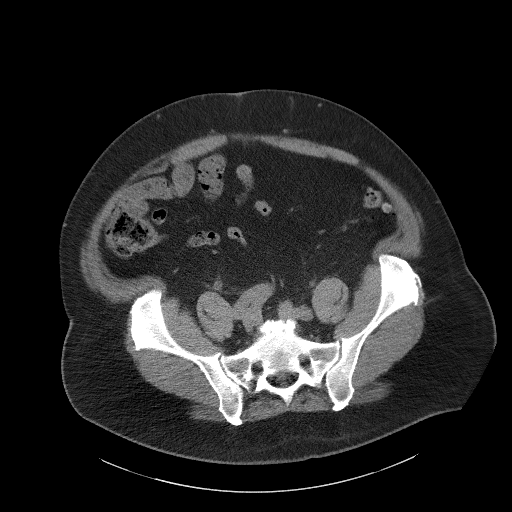
[im 41/99  soft-tissue]
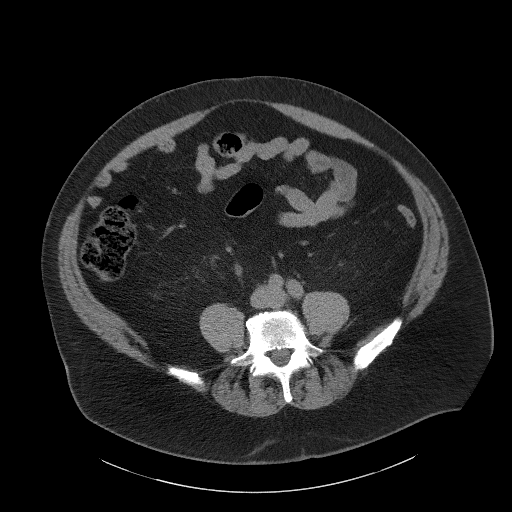
[im 50/99  soft-tissue]
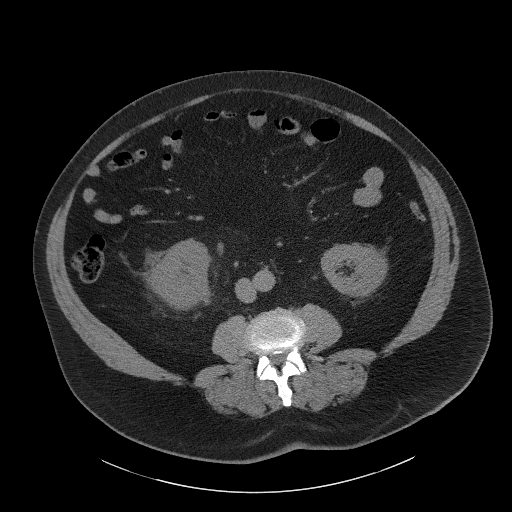
[im 58/99  soft-tissue]
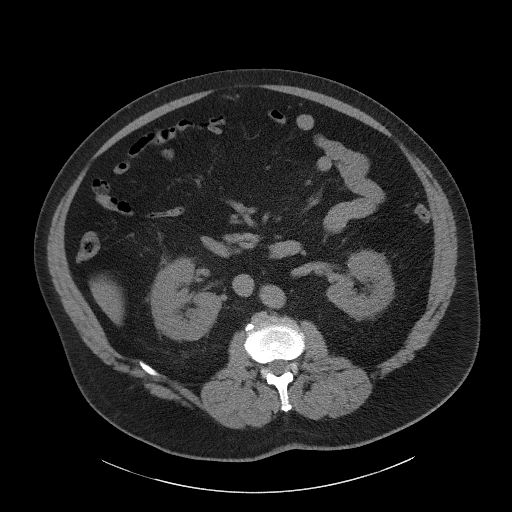
[im 66/99  soft-tissue]
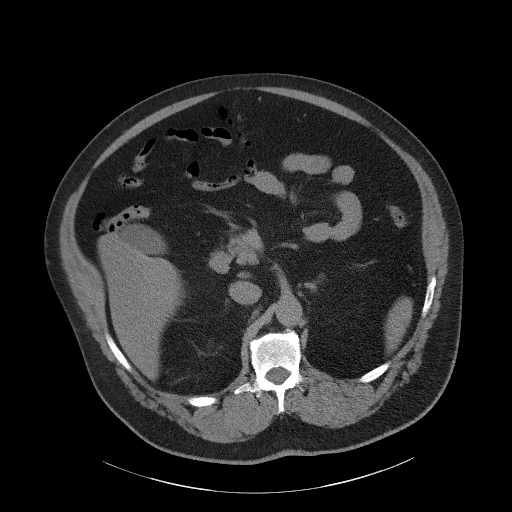
[im 66/99  bone]
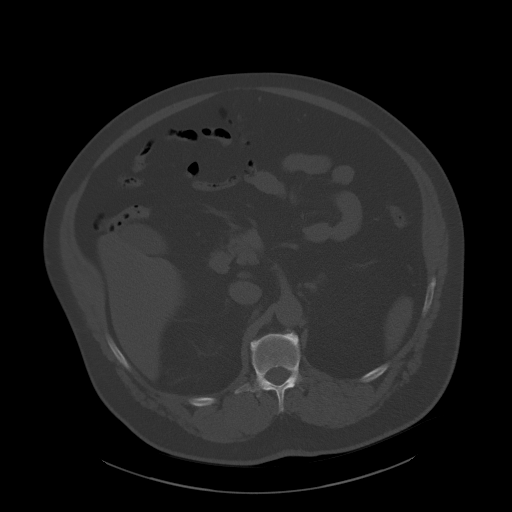
[im 70/99  soft-tissue]
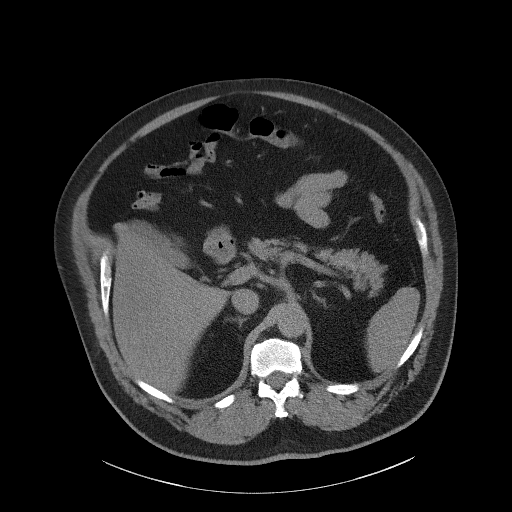
[im 78/99  soft-tissue]
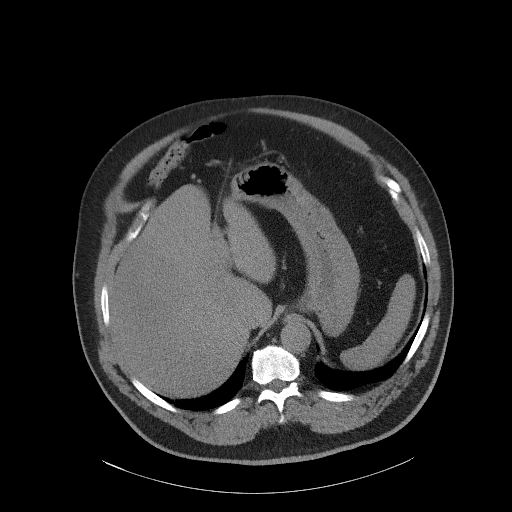
[im 86/99  soft-tissue]
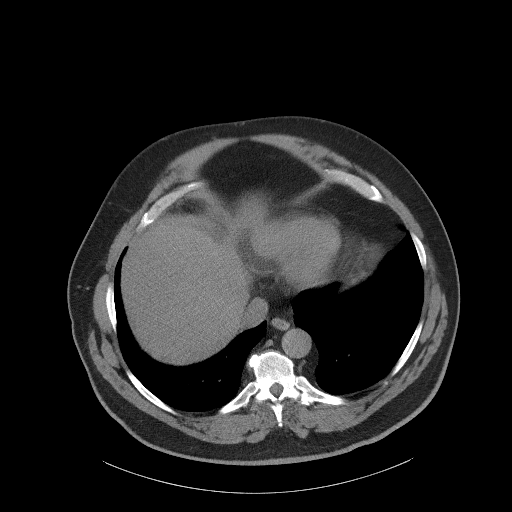
[im 94/99  soft-tissue]
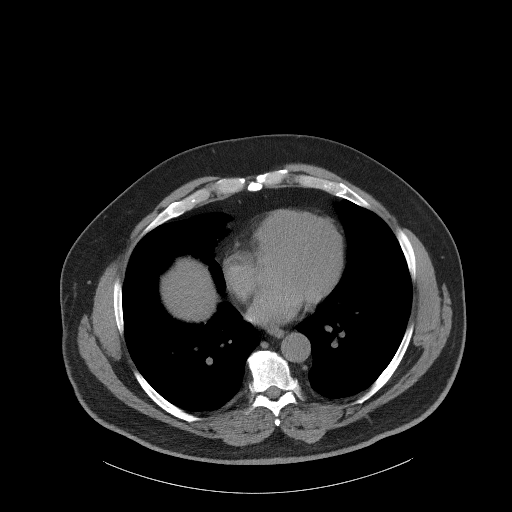

[Series 4: coronal st · coronal · 0.96mm/px · 3 of 124 slices shown]
[im 42/124  soft-tissue]
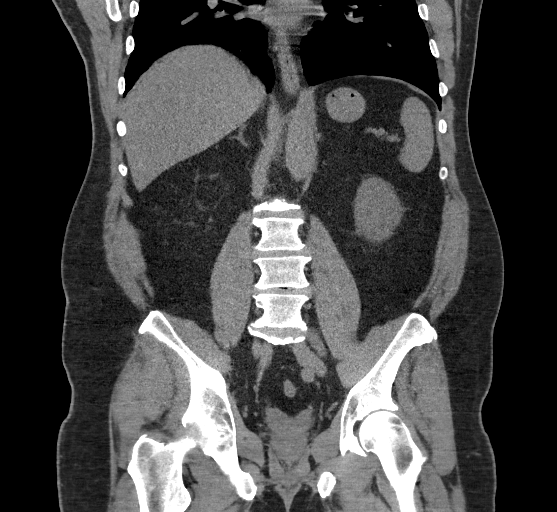
[im 55/124  soft-tissue]
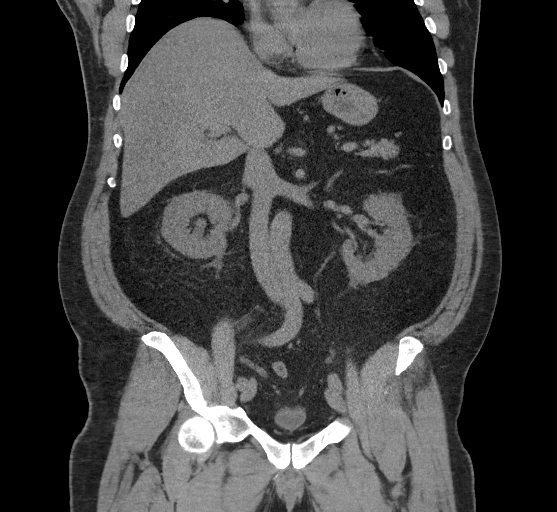
[im 69/124  soft-tissue]
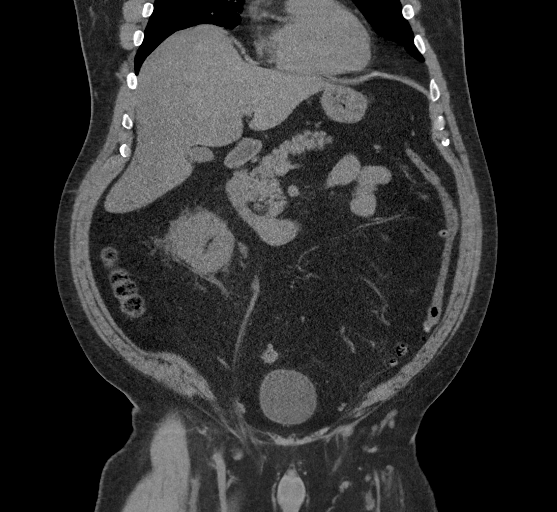

[16 of 46 positions shown; findings below may reference images not displayed]

FINDINGS: Please note that parenchymal and vascular abnormalities may be
missed as intravenous contrast was not administered.

Lower chest: No acute abnormality identified.

Hepatobiliary: Hepatic steatosis noted without focal hepatic
abnormality. The gallbladder is unremarkable. There is no evidence
of intrahepatic or extrahepatic biliary dilatation.

Pancreas: Unremarkable

Spleen: Unremarkable

Adrenals/Urinary Tract: A 2 mm RIGHT UVJ calculus causes mild RIGHT
hydroureteronephrosis and perinephric inflammation.

No other renal abnormalities are noted.

The adrenal glands are unremarkable.

Stomach/Bowel: Stomach is within normal limits. Appendix appears
normal. No evidence of bowel wall thickening, distention, or
inflammatory changes.

Colonic diverticulosis noted without evidence of diverticulitis.

Vascular/Lymphatic: No significant vascular findings are present. No
enlarged abdominal or pelvic lymph nodes.

Reproductive: Prostate is unremarkable.

Other: No ascites, focal collection or pneumoperitoneum.

Musculoskeletal: No acute or suspicious bony abnormalities are
noted. Mild degenerative disc disease/spondylosis in the lumbar
spine again identified.
IMPRESSION: 1. 2 mm RIGHT UVJ calculus causing mild RIGHT hydroureteronephrosis
and perinephric inflammation.
2. Hepatic steatosis.
